# Patient Record
Sex: Female | Born: 1990
Health system: Southern US, Community
[De-identification: ages and names within clinical notes are randomized; demographics above are authoritative.]

## PROBLEM LIST (undated history)

## (undated) DIAGNOSIS — E119 Type 2 diabetes mellitus without complications: Secondary | ICD-10-CM

## (undated) DIAGNOSIS — R5382 Chronic fatigue, unspecified: Secondary | ICD-10-CM

## (undated) DIAGNOSIS — E8881 Metabolic syndrome: Secondary | ICD-10-CM

## (undated) DIAGNOSIS — G43109 Migraine with aura, not intractable, without status migrainosus: Secondary | ICD-10-CM

## (undated) DIAGNOSIS — M2550 Pain in unspecified joint: Secondary | ICD-10-CM

## (undated) DIAGNOSIS — E785 Hyperlipidemia, unspecified: Secondary | ICD-10-CM

## (undated) DIAGNOSIS — K589 Irritable bowel syndrome without diarrhea: Secondary | ICD-10-CM

## (undated) DIAGNOSIS — E282 Polycystic ovarian syndrome: Secondary | ICD-10-CM

## (undated) DIAGNOSIS — D509 Iron deficiency anemia, unspecified: Secondary | ICD-10-CM

## (undated) DIAGNOSIS — M797 Fibromyalgia: Secondary | ICD-10-CM

## (undated) DIAGNOSIS — G9332 Myalgic encephalomyelitis/chronic fatigue syndrome: Secondary | ICD-10-CM

## (undated) DIAGNOSIS — E559 Vitamin D deficiency, unspecified: Secondary | ICD-10-CM

## (undated) DIAGNOSIS — Z8632 Personal history of gestational diabetes: Secondary | ICD-10-CM

## (undated) DIAGNOSIS — M549 Dorsalgia, unspecified: Secondary | ICD-10-CM

## (undated) DIAGNOSIS — F419 Anxiety disorder, unspecified: Secondary | ICD-10-CM

## (undated) DIAGNOSIS — E039 Hypothyroidism, unspecified: Secondary | ICD-10-CM

## (undated) HISTORY — DX: Iron deficiency anemia, unspecified: D50.9

## (undated) HISTORY — DX: Pain in unspecified joint: M25.50

## (undated) HISTORY — DX: Irritable bowel syndrome, unspecified: K58.9

## (undated) HISTORY — DX: Migraine with aura, not intractable, without status migrainosus: G43.109

## (undated) HISTORY — DX: Vitamin D deficiency, unspecified: E55.9

## (undated) HISTORY — PX: TONSILLECTOMY: SUR1361

## (undated) HISTORY — DX: Metabolic syndrome: E88.81

## (undated) HISTORY — DX: Metabolic syndrome: E88.810

## (undated) HISTORY — DX: Chronic fatigue, unspecified: R53.82

## (undated) HISTORY — DX: Personal history of gestational diabetes: Z86.32

## (undated) HISTORY — DX: Type 2 diabetes mellitus without complications: E11.9

## (undated) HISTORY — DX: Polycystic ovarian syndrome: E28.2

## (undated) HISTORY — DX: Myalgic encephalomyelitis/chronic fatigue syndrome: G93.32

## (undated) HISTORY — DX: Fibromyalgia: M79.7

## (undated) HISTORY — DX: Anxiety disorder, unspecified: F41.9

## (undated) HISTORY — DX: Hyperlipidemia, unspecified: E78.5

## (undated) HISTORY — DX: Hypothyroidism, unspecified: E03.9

## (undated) HISTORY — DX: Dorsalgia, unspecified: M54.9

---

## 2008-12-01 ENCOUNTER — Ambulatory Visit: Payer: Self-pay | Admitting: Family Medicine

## 2008-12-01 DIAGNOSIS — R5383 Other fatigue: Secondary | ICD-10-CM

## 2008-12-01 DIAGNOSIS — R5381 Other malaise: Secondary | ICD-10-CM

## 2008-12-01 LAB — CONVERTED CEMR LAB: Hemoglobin: 13.8 g/dL

## 2008-12-02 ENCOUNTER — Encounter: Payer: Self-pay | Admitting: Family Medicine

## 2008-12-02 LAB — CONVERTED CEMR LAB
ALT: 12 units/L (ref 0–35)
AST: 14 units/L (ref 0–37)
Albumin: 4.3 g/dL (ref 3.5–5.2)
Alkaline Phosphatase: 39 units/L (ref 39–117)
BUN: 14 mg/dL (ref 6–23)
CO2: 23 meq/L (ref 19–32)
Calcium: 9.5 mg/dL (ref 8.4–10.5)
Chloride: 104 meq/L (ref 96–112)
Creatinine, Ser: 0.65 mg/dL (ref 0.40–1.20)
Glucose, Bld: 71 mg/dL (ref 70–99)
HCT: 38.8 % (ref 36.0–46.0)
Hemoglobin: 12.8 g/dL (ref 12.0–15.0)
INR: 1.1 (ref 0.0–1.5)
MCHC: 33 g/dL (ref 30.0–36.0)
MCV: 81.9 fL (ref 78.0–100.0)
Platelets: 248 10*3/uL (ref 150–400)
Potassium: 4.4 meq/L (ref 3.5–5.3)
Prothrombin Time: 15 s (ref 11.6–15.2)
RBC: 4.74 M/uL (ref 3.87–5.11)
RDW: 13.7 % (ref 11.5–15.5)
Sodium: 138 meq/L (ref 135–145)
TSH: 1.69 microintl units/mL (ref 0.350–4.500)
Total Bilirubin: 0.4 mg/dL (ref 0.3–1.2)
Total Protein: 7.3 g/dL (ref 6.0–8.3)
Vit D, 25-Hydroxy: 31 ng/mL (ref 30–89)
Vitamin B-12: 470 pg/mL (ref 211–911)
WBC: 7 10*3/uL (ref 4.0–10.5)

## 2008-12-12 ENCOUNTER — Encounter: Payer: Self-pay | Admitting: Family Medicine

## 2009-01-13 ENCOUNTER — Encounter: Payer: Self-pay | Admitting: Family Medicine

## 2009-01-14 ENCOUNTER — Encounter: Payer: Self-pay | Admitting: Family Medicine

## 2009-01-28 ENCOUNTER — Encounter: Payer: Self-pay | Admitting: Family Medicine

## 2009-05-14 ENCOUNTER — Ambulatory Visit: Payer: Self-pay | Admitting: Family Medicine

## 2009-05-14 DIAGNOSIS — R109 Unspecified abdominal pain: Secondary | ICD-10-CM

## 2009-05-15 ENCOUNTER — Encounter: Payer: Self-pay | Admitting: Family Medicine

## 2009-05-16 LAB — CONVERTED CEMR LAB
Trich, Wet Prep: NONE SEEN
Yeast Wet Prep HPF POC: NONE SEEN

## 2009-05-22 ENCOUNTER — Encounter: Admission: RE | Admit: 2009-05-22 | Discharge: 2009-05-22 | Payer: Self-pay | Admitting: Family Medicine

## 2009-10-14 ENCOUNTER — Ambulatory Visit: Payer: Self-pay | Admitting: Family Medicine

## 2009-10-15 LAB — CONVERTED CEMR LAB
ALT: 15 units/L (ref 0–35)
AST: 17 units/L (ref 0–37)
Albumin: 4.7 g/dL (ref 3.5–5.2)
Alkaline Phosphatase: 40 units/L (ref 39–117)
Amylase: 35 units/L (ref 0–105)
BUN: 11 mg/dL (ref 6–23)
Basophils Absolute: 0 10*3/uL (ref 0.0–0.1)
Basophils Relative: 0 % (ref 0–1)
CO2: 26 meq/L (ref 19–32)
Calcium: 9.3 mg/dL (ref 8.4–10.5)
Chloride: 103 meq/L (ref 96–112)
Creatinine, Ser: 0.85 mg/dL (ref 0.40–1.20)
Eosinophils Absolute: 0.1 10*3/uL (ref 0.0–0.7)
Eosinophils Relative: 2 % (ref 0–5)
Glucose, Bld: 79 mg/dL (ref 70–99)
HCT: 38.6 % (ref 36.0–46.0)
Hemoglobin: 12.9 g/dL (ref 12.0–15.0)
Lipase: 15 units/L (ref 0–75)
Lymphocytes Relative: 27 % (ref 12–46)
Lymphs Abs: 1.8 10*3/uL (ref 0.7–4.0)
MCHC: 33.4 g/dL (ref 30.0–36.0)
MCV: 82.7 fL (ref 78.0–100.0)
Monocytes Absolute: 0.8 10*3/uL (ref 0.1–1.0)
Monocytes Relative: 12 % (ref 3–12)
Neutro Abs: 3.9 10*3/uL (ref 1.7–7.7)
Neutrophils Relative %: 59 % (ref 43–77)
Platelets: 254 10*3/uL (ref 150–400)
Potassium: 4 meq/L (ref 3.5–5.3)
RBC: 4.67 M/uL (ref 3.87–5.11)
RDW: 13.4 % (ref 11.5–15.5)
Sodium: 137 meq/L (ref 135–145)
Total Bilirubin: 0.5 mg/dL (ref 0.3–1.2)
Total Protein: 7.6 g/dL (ref 6.0–8.3)
WBC: 6.6 10*3/uL (ref 4.0–10.5)

## 2009-10-27 ENCOUNTER — Telehealth: Payer: Self-pay | Admitting: Family Medicine

## 2010-02-25 ENCOUNTER — Ambulatory Visit: Payer: Self-pay | Admitting: Family Medicine

## 2010-02-25 DIAGNOSIS — R42 Dizziness and giddiness: Secondary | ICD-10-CM

## 2010-02-26 ENCOUNTER — Encounter: Payer: Self-pay | Admitting: Family Medicine

## 2010-02-26 LAB — CONVERTED CEMR LAB
Clue Cells Wet Prep HPF POC: NONE SEEN
Free T4: 1.21 ng/dL (ref 0.80–1.80)
Iron: 42 ug/dL (ref 42–145)
T3, Free: 3.4 pg/mL (ref 2.3–4.2)
TSH: 1.621 microintl units/mL (ref 0.350–4.500)
Trich, Wet Prep: NONE SEEN
Vit D, 25-Hydroxy: 33 ng/mL (ref 30–89)
Vitamin B-12: 443 pg/mL (ref 211–911)
WBC, Wet Prep HPF POC: NONE SEEN

## 2010-03-08 ENCOUNTER — Telehealth: Payer: Self-pay | Admitting: Family Medicine

## 2010-03-12 ENCOUNTER — Encounter: Payer: Self-pay | Admitting: Family Medicine

## 2010-03-17 ENCOUNTER — Encounter: Payer: Self-pay | Admitting: Family Medicine

## 2010-03-29 ENCOUNTER — Telehealth: Payer: Self-pay | Admitting: Family Medicine

## 2010-04-23 ENCOUNTER — Ambulatory Visit: Payer: Self-pay | Admitting: Family Medicine

## 2010-04-23 DIAGNOSIS — D509 Iron deficiency anemia, unspecified: Secondary | ICD-10-CM

## 2010-04-23 DIAGNOSIS — E559 Vitamin D deficiency, unspecified: Secondary | ICD-10-CM | POA: Insufficient documentation

## 2010-04-23 HISTORY — DX: Iron deficiency anemia, unspecified: D50.9

## 2010-04-27 ENCOUNTER — Telehealth: Payer: Self-pay | Admitting: Family Medicine

## 2010-04-30 ENCOUNTER — Encounter: Admission: RE | Admit: 2010-04-30 | Discharge: 2010-04-30 | Payer: Self-pay | Admitting: Family Medicine

## 2010-05-11 ENCOUNTER — Encounter: Payer: Self-pay | Admitting: Family Medicine

## 2010-05-13 ENCOUNTER — Ambulatory Visit: Payer: Self-pay | Admitting: Family Medicine

## 2010-05-26 ENCOUNTER — Encounter: Payer: Self-pay | Admitting: Family Medicine

## 2010-06-14 ENCOUNTER — Ambulatory Visit: Payer: Self-pay | Admitting: Family Medicine

## 2010-08-08 ENCOUNTER — Encounter: Payer: Self-pay | Admitting: Family Medicine

## 2010-08-10 ENCOUNTER — Telehealth: Payer: Self-pay | Admitting: Family Medicine

## 2010-08-13 ENCOUNTER — Encounter: Payer: Self-pay | Admitting: Family Medicine

## 2010-08-17 NOTE — Assessment & Plan Note (Signed)
Summary: FAtigue, dizziness, etc   Vital Signs:  Patient profile:   20 year old female Height:      65 inches Weight:      172 pounds Pulse rate:   88 / minute BP sitting:   120 / 73  (left arm) Cuff size:   regular  Vitals Entered By: Kathlene November (February 25, 2010 9:47 AM) CC: when wakes up and does not eat gets real weak and dizzy and then when eats gets bloated, nausea and tightness in the abdomin even on the Protonix   Primary Care Provider:  Nani Gasser MD  CC:  when wakes up and does not eat gets real weak and dizzy and then when eats gets bloated and nausea and tightness in the abdomin even on the Protonix.  History of Present Illness: when wakes up and does not eat gets real weak and dizzy and then when eats gets bloated, nausea and tightness in the abdomin even on the Protonix.  if waits too long to eat feels really dizzy, lightheaded, and feels shakey all over. Then when does get to eat feels very bloated, tight with a meal.  Doesn't happen with a small snack.  Protonix helped intially but no longer working. She is still on it.  Always feel tired.  Can't lose weight. Has been personal training for 2 months and not losing weight.Not sure if gaining muscle.  Eats about 1500 calories a day.  Feeling very fatigued like the "life has been sucked out of her". She says has felt like this in the past when her iron was low and a second time when her vitamin D was low.   Having vaginiatl itching and irrtation. thinks she has a yeast infection again. No longer having problems with her ovarian cysts so has not needed to start OCPs.   Current Medications (verified): 1)  Pantoprazole Sodium 40 Mg Tbec (Pantoprazole Sodium) .... Take 1 Tablet By Mouth Once A Day  Allergies (verified): 1)  ! * Cephalasporins  Comments:  Nurse/Medical Assistant: The patient's medications and allergies were reviewed with the patient and were updated in the Medication and Allergy Lists. Kathlene November  (February 25, 2010 9:48 AM)  Family History: Reviewed history from 12/01/2008 and no changes required. GF with LUng CA Mom and sister with depression GM with DM GF with DM Father with HTN and thyroid dz.   Physical Exam  General:  Well-developed,well-nourished,in no acute distress; alert,appropriate and cooperative throughout examination Head:  Normocephalic and atraumatic without obvious abnormalities. No apparent alopecia or balding. Neck:  No deformities, masses, or tenderness noted. NO TM.  Lungs:  Normal respiratory effort, chest expands symmetrically. Lungs are clear to auscultation, no crackles or wheezes. Heart:  Normal rate and regular rhythm. S1 and S2 normal without gallop, murmur, click, rub or other extra sounds. Abdomen:  Bowel sounds positive,abdomen soft and non-tender without masses, organomegaly or hernias noted. Skin:  no rashes.   Cervical Nodes:  No lymphadenopathy noted Psych:  Cognition and judgment appear intact. Alert and cooperative with normal attention span and concentration. No apparent delusions, illusions, hallucinations   Impression & Recommendations:  Problem # 1:  FATIGUE (ICD-780.79) Discussed will check iron, b12, vit D and TSH. does have family hx of thyroid dz and has been unable to lose weight.  Orders: T-TSH [phone]) T-Iron [phone]) T-Vitamin B12 [phone]) T-Vitamin D (25-Hydroxy) [phone]) T-T3, Free [phone]) T-T4, Free [phone])  Problem # 2:  DIZZINESS (ICD-780.4) Discussed that this is directly related  to not eating. likely getting some hypglycemia when skips meals. Make sur to eat regularly and healthy snacks in between.    Problem # 3:  VAGINITIS (ICD-616.10)  Orders: T-Wet Prep for Trich, Yeast, Clue Cells [phone])  Discussed symptomatic relief and treatment options.   Complete Medication List: 1)  Pantoprazole Sodium 40 Mg Tbec (Pantoprazole sodium) .... Take 1 tablet by mouth once a  day  Patient Instructions: 1)  Start a daily Women's one a day multivitamin 2)  We will call you with your lab results.

## 2010-08-17 NOTE — Letter (Signed)
Summary: Anxiety Questionnaire  Anxiety Questionnaire   Imported By: Lanelle Bal 05/05/2010 14:22:02  _____________________________________________________________________  External Attachment:    Type:   Image     Comment:   External Document

## 2010-08-17 NOTE — Assessment & Plan Note (Signed)
Summary: Wants to go on Birth-Control   Vital Signs:  Patient profile:   20 year old female Height:      65 inches Weight:      179 pounds BMI:     29.89 O2 Sat:      99 % on Room air Pulse rate:   93 / minute BP sitting:   124 / 79  (left arm) Cuff size:   regular  Vitals Entered By: Payton Spark CMA (May 13, 2010 1:24 PM)  O2 Flow:  Room air CC: Discuss starting OCPs   Primary Care Provider:  Nani Gasser MD  CC:  Discuss starting OCPs.  History of Present Illness: Here to dsicuss OCPs. having pelvic pain. [place] was neg for ovarian cysts.  She would like to start OCPs to help regulate her periods and she is getting married this summer. She is worried about her mood starting the pill.  She says her sister became depressed after starting th epill and she wonders if she should start an anitidepressant with the pill.    Current Medications (verified): 1)  None  Allergies (verified): 1)  ! * Cephalasporins  Physical Exam  General:  Well-developed,well-nourished,in no acute distress; alert,appropriate and cooperative throughout examination Head:  Normocephalic and atraumatic without obvious abnormalities. No apparent alopecia or balding.   Impression & Recommendations:  Problem # 1:  CONTRACEPTIVE MANAGEMENT (ICD-V25.09) Discussed options. she wants something that will help with her acne as well. Will start wtih sprintec. Reviewed how to appropriately take the medications and what to do if misses a pill.  Monitor for depression or mood change on teh pill. 20 min spent face to face in counseling.  Recheck BP in one month. warned about potential SE.  Complete Medication List: 1)  Sprintec 28 0.25-35 Mg-mcg Tabs (Norgestimate-eth estradiol) .... Take 1 tablet by mouth once a day  Patient Instructions: 1)  Please schedule a follow-up appointment in 1 month for blood pressure check with nurse.  2)  Call me if unhappy with the pill in a couple of months.    Prescriptions: SPRINTEC 28 0.25-35 MG-MCG TABS (NORGESTIMATE-ETH ESTRADIOL) Take 1 tablet by mouth once a day  #1 pack x 0   Entered and Authorized by:   Nani Gasser MD   Signed by:   Nani Gasser MD on 05/13/2010   Method used:   Electronically to        [facility]  [id number] (retail)       [address]       [place], [place]  [address]       Ph: [phone]       Fax: [phone]   RxID:   [id number]    Orders Added: 1)  Est. Patient Level III [[address]]

## 2010-08-17 NOTE — Progress Notes (Signed)
Summary: GI referral  Phone Note Call from Patient Call back at Home Phone [phone]   Caller: Patient Call For: Nani Gasser MD Summary of Call: Pt wants get a GI referral since everything came back normal.Prefers [facility] in [place] Initial call taken by: Kathlene November LPN,  April 27, 2010 9:33 AM  Follow-up for Phone Call        Pls let her know will await the [place] results before refer to GI.  Follow-up by: Nani Gasser MD,  April 27, 2010 10:33 AM  Additional Follow-up for Phone Call Additional follow up Details #1::        called and notified pt and pt voiced understanding.Pt has an ultrsound appt scheduled for this Friday Additional Follow-up by: Avon Gully CMA, Duncan Dull),  April 27, 2010 11:21 AM

## 2010-08-17 NOTE — Assessment & Plan Note (Signed)
Summary: Epigastric pain   Vital Signs:  Patient profile:   20 year old female Height:      65 inches Weight:      169 pounds BMI:     28.22 Temp:     98.1 degrees F oral Pulse rate:   90 / minute BP sitting:   124 / 73  (left arm) Cuff size:   regular  Vitals Entered By: Kathlene November (October 14, 2009 11:21 AM) CC: everytime eats upper epigastric area bloats feels tight and some cramps for 2 months now   Primary Care Provider:  Nani Gasser MD  CC:  everytime eats upper epigastric area bloats feels tight and some cramps for 2 months now.  History of Present Illness: everytime eats upper epigastric area bloats feels tight and some cramps for 2 months now.  Has been getting worse.  Feels tender in the epigstric area.  Pain is mild.  When happens has to sit down. Hurts to stand.  Doesn't feel gassy. No heartburn or reflux.  No family hx of gallbladder problems.  Occ gets nauseated if acidic foods or large amounts. No blood in the stool. Feels she has not changed her eating habits and is stillworking out 2 x a week but still gaining weight. Weight is up about 7 lbs from October. No alleviating sxs. No meds.   Current Medications (verified): 1)  None  Allergies (verified): 1)  ! * Cephalasporins  Comments:  Nurse/Medical Assistant: The patient's medications and allergies were reviewed with the patient and were updated in the Medication and Allergy Lists. Kathlene November (October 14, 2009 11:22 AM)  Physical Exam  General:  Well-developed,well-nourished,in no acute distress; alert,appropriate and cooperative throughout examination Head:  Normocephalic and atraumatic without obvious abnormalities. No apparent alopecia or balding. Lungs:  Normal respiratory effort, chest expands symmetrically. Lungs are clear to auscultation, no crackles or wheezes. Heart:  Normal rate and regular rhythm. S1 and S2 normal without gallop, murmur, click, rub or other extra sounds. Abdomen:  soft,  normal bowel sounds, no distention, no masses, no hepatomegaly, and no splenomegaly.  Tender in teh epigastrum and thr RUQ.   Skin:  no rashes.   Psych:  Cognition and judgment appear intact. Alert and cooperative with normal attention span and concentration. No apparent delusions, illusions, hallucinations   Impression & Recommendations:  Problem # 1:  EPIGASTRIC PAIN (ICD-789.06) Discussed differential of Gastritis vs GB vs pancreas  Will start a PPI for sxs releif. Samples of Protonix given.   Will get labs to rule out liver or pancreas d/o.  Will schedule a GB US to eval for stones.  If PPI is helping then please call the office and let me know.  Orders: T-Comprehensive Metabolic Panel [phone]) T-CBC w/Diff [phone]) T-Amylase [phone]) T-Lipase [phone]) T-Ultrasound Abdominal, Complete ([id number])

## 2010-08-17 NOTE — Assessment & Plan Note (Signed)
Summary: F/u on BP with Birthcontrol- jr  Nurse Visit   Vital Signs:  Patient profile:   20 year old female Pulse rate:   67 / minute BP sitting:   123 / 79  (right arm)  Primary Care Provider:  Nani Gasser MD   History of Present Illness: BP check pt is on Sprintec.    Impression & Recommendations:  Problem # 1:  CONTRACEPTIVE MANAGEMENT (ICD-V25.09) BP looks great on OCPs.   Complete Medication List: 1)  Sprintec 28 0.25-35 Mg-mcg Tabs (Norgestimate-eth estradiol) .... Take 1 tablet by mouth once a day   Allergies: 1)  ! * Cephalasporins  Orders Added: 1)  Est. Patient Level I [[address]] Prescriptions: SPRINTEC 28 0.25-35 MG-MCG TABS (NORGESTIMATE-ETH ESTRADIOL) Take 1 tablet by mouth once a day  #1 pack x 3   Entered by:   Avon Gully CMA, (AAMA)   Authorized by:   Nani Gasser MD   Signed by:   Avon Gully CMA, (AAMA) on 06/14/2010   Method used:   Electronically to        [facility] [phone]* (retail)       [address]       [place], [place]  [address]       Ph: [phone]       Fax: [phone]   RxID:   [id number]

## 2010-08-17 NOTE — Progress Notes (Signed)
Summary: protonix helped- would like a rx  Phone Note Call from Patient   Summary of Call: Protonix helped alot- has no symptoms. Can she get a rx called into [facility] Initial call taken by: Kathlene November,  October 27, 2009 11:35 AM    New/Updated Medications: PANTOPRAZOLE SODIUM 40 MG TBEC (PANTOPRAZOLE SODIUM) Take 1 tablet by mouth once a day Prescriptions: PANTOPRAZOLE SODIUM 40 MG TBEC (PANTOPRAZOLE SODIUM) Take 1 tablet by mouth once a day  #30 x 1   Entered and Authorized by:   Nani Gasser MD   Signed by:   Nani Gasser MD on 10/27/2009   Method used:   Electronically to        [facility]  [id number] (retail)       [address]       [place], [place]  [address]       Ph: [phone]       Fax: [phone]   RxID:   [id number]

## 2010-08-17 NOTE — Progress Notes (Signed)
Summary: referral  Phone Note Call from Patient   Caller: Patient Call For: Nani Gasser MD Summary of Call: Pt called and states since her labs were normal she wants to be referred to an Endocrinologist Initial call taken by: Avon Gully CMA, Duncan Dull),  March 08, 2010 4:46 PM  Follow-up for Phone Call        OK ,will refer.  Follow-up by: Nani Gasser MD,  March 08, 2010 4:47 PM  Additional Follow-up for Phone Call Additional follow up Details #1::        pt notified Additional Follow-up by: Avon Gully CMA, Duncan Dull),  March 08, 2010 4:58 PM

## 2010-08-17 NOTE — Consult Note (Signed)
Summary: [facility]   Imported By: Lanelle Bal 05/26/2010 11:19:15  _____________________________________________________________________  External Attachment:    Type:   Image     Comment:   External Document

## 2010-08-17 NOTE — Procedures (Signed)
Summary: [facility]  [facility]   Imported By: Lanelle Bal 06/08/2010 11:17:54  _____________________________________________________________________  External Attachment:    Type:   Image     Comment:   External Document

## 2010-08-17 NOTE — Progress Notes (Signed)
Summary: Yeast infection med  Phone Note Call from Patient Call back at Home Phone [phone]   Caller: Patient Call For: Nani Gasser MD Summary of Call: Pt calls to ask if she can get a rx for Diflucan to use as needed for yeast infections. States you and her have talked about it nad you told her she is just prone to them and she really didn't want to keep coming in office for them since gets them regularly. Has one now Initial call taken by: Kathlene November,  March 29, 2010 9:44 AM  Follow-up for Phone Call        I gave her one rx wtih 2 refills 4 weeks ago.  She probably didn't realize she has refills at the pharmacy.  Follow-up by: Nani Gasser MD,  March 29, 2010 12:23 PM  Additional Follow-up for Phone Call Additional follow up Details #1::        Pt notifed via VM of above info Additional Follow-up by: Kathlene November,  March 29, 2010 12:46 PM

## 2010-08-17 NOTE — Consult Note (Signed)
Summary: [facility]  [facility]   Imported By: Sherian Rein 04/03/2010 10:41:34  _____________________________________________________________________  External Attachment:    Type:   Image     Comment:   External Document

## 2010-08-17 NOTE — Assessment & Plan Note (Signed)
Summary: F/U pelvic Pain   Vital Signs:  Patient profile:   20 year old female Height:      65 inches Weight:      176 pounds Pulse rate:   87 / minute BP sitting:   110 / 67  (right arm) Cuff size:   regular  Vitals Entered By: Avon Gully CMA, Duncan Dull) (April 23, 2010 1:54 PM) CC: fatigue, weak,nausea, feels anxious, lower abd pain x 2 months, taking womesn multivit not helping   Primary Care Provider:  Nani Gasser MD  CC:  fatigue, weak, nausea, feels anxious, lower abd pain x 2 months, and taking womesn multivit not helping.  History of Present Illness: fatigue, weak,nausea, feels anxious, lower abd pain x 2 months, taking womesn multivit not helping.  Not sleeping well. Having difficulty falling sleep. Her acne is worse.  DIfficulty lossing weight.  Gaining weight in her stomach.  Feels like has to pee all the time.  Will feel constipate but then will go more than often. Severe pain in her lower stomach like her cyst.  USually coupled wiht back pain. Initially thought pulled a muscle but not better. Pain is dull. When stomach hurts the back pain becomes peircing. Stomach feels tight.  Dec appetite. Early satiety.  Did start heriron and vita D supplement. Did have blood in the stool the first month but not his last month. Periods are more heavy and painful than usual.   Saw endocrinology and told doesn't have PCOS adn normal thyroid.  Body will ache all over.   Current Medications (verified): 1)  Pantoprazole Sodium 40 Mg Tbec (Pantoprazole Sodium) .... Take 1 Tablet By Mouth Once A Day  Allergies (verified): 1)  ! * Cephalasporins  Comments:  Nurse/Medical Assistant: The patient's medications and allergies were reviewed with the patient and were updated in the Medication and Allergy Lists. Avon Gully CMA, Duncan Dull) (April 23, 2010 1:55 PM)  Social History: Reviewed history from 12/01/2008 and no changes required. 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  Single. Lives at home with mom, dad, sisters.   Never Smoked Alcohol use-no Drug use-no Regular exercise-yes  Physical Exam  General:  Well-developed,well-nourished,in no acute distress; alert,appropriate and cooperative throughout examination Skin:  no rashes.   Psych:  Cognition and judgment appear intact. Alert and cooperative with normal attention span and concentration. No apparent delusions, illusions, hallucinations   Impression & Recommendations:  Problem # 1:  PELVIC  PAIN (ICD-789.09)  Will schedule for pelvic US. His of recurrent ovarian cysts as a  teenager and her sxs feel similar to these but worse and more persistant. Has felt like this for 3 months.  Also will test for gluten enteropahty and lactose intolerance. If [place] and tests are normla will refer to GI.  Consider may be anxiety/depression related as her uncle drowned this summer Her GAD-7 socre was 8 today. 20 min spent in dicussion and counseling.  Orders: T-*Unlisted Diagnostic X-ray test/procedure [id number]) T- * Misc. Laboratory test [phone]) T-Gliadin Peptide Antibodies, IgG, LgA ([id number])  Problem # 2:  ANEMIA, IRON DEFICIENCY (ICD-280.9) Due to rehceck since has been taking a supplement.  Orders: T-Iron ([id number])  Problem # 3:  VITAMIN D DEFICIENCY (ICD-268.9) Due to recheck since has been taking a supplement.  Orders: T-Vitamin D (25-Hydroxy) [id number])  Complete Medication List: 1)  Pantoprazole Sodium 40 Mg Tbec (Pantoprazole sodium) .... Take 1 tablet by mouth once a day  Patient Instructions: 1)  We  will call you with the [place] appt.   2)  We will call you with your lab results.   Contraindications/Deferment of Procedures/Staging:    Test/Procedure: FLU VAX    Reason for deferment: patient declined

## 2010-08-18 IMAGING — US US PELVIS COMPLETE
1 series · 14 of 25 positions shown · non-contrast
Comparison: None

CLINICAL DATA: Recurring pelvic pain.  Evaluate for ovarian cyst.
The patient is not sexually active.  Although both transabdominal
and transvaginal studies requested, only the transabdominal study
could be performed.

TRANSABDOMINAL ULTRASOUND OF PELVIS
TECHNIQUE: Transabdominal ultrasound examination of the pelvis was
performed including evaluation of the uterus, ovaries, adnexal
regions, and pelvic cul-de-sac.

[Series 1: us pelvis complete · 0.23mm/px · 14 of 35 slices shown]
[im 1/35]
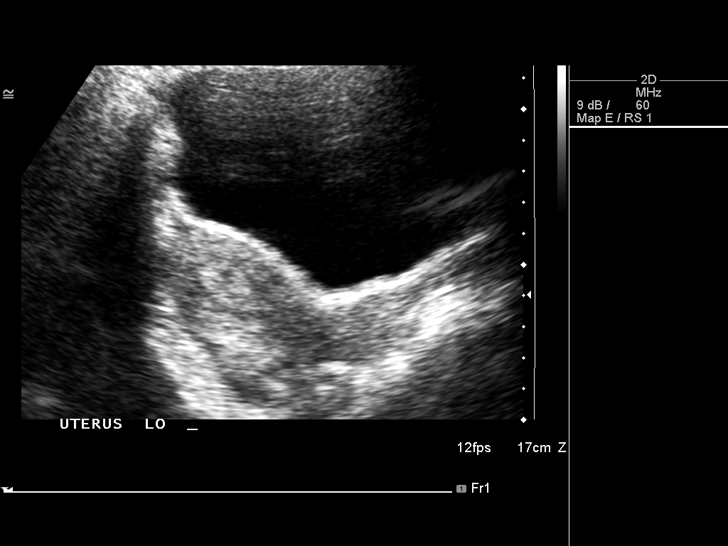
[im 3/35]
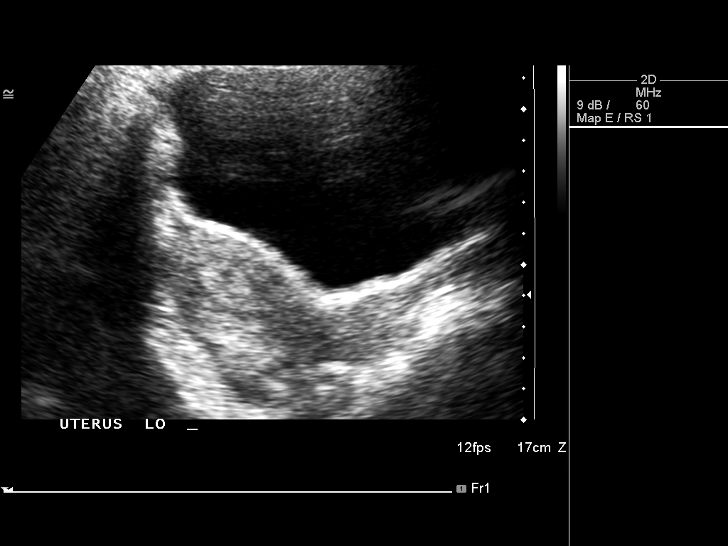
[im 6/35]
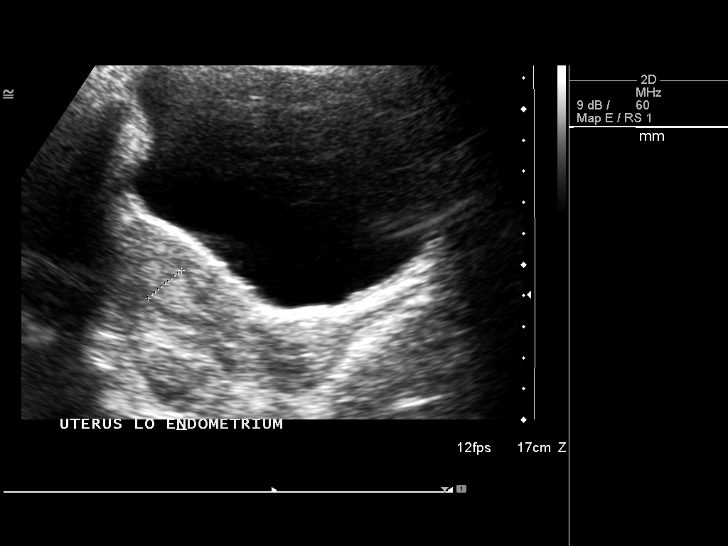
[im 9/35]
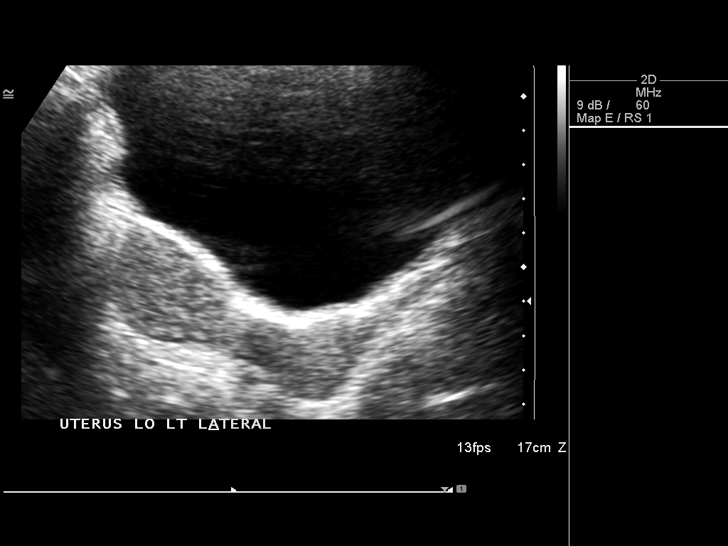
[im 12/35]
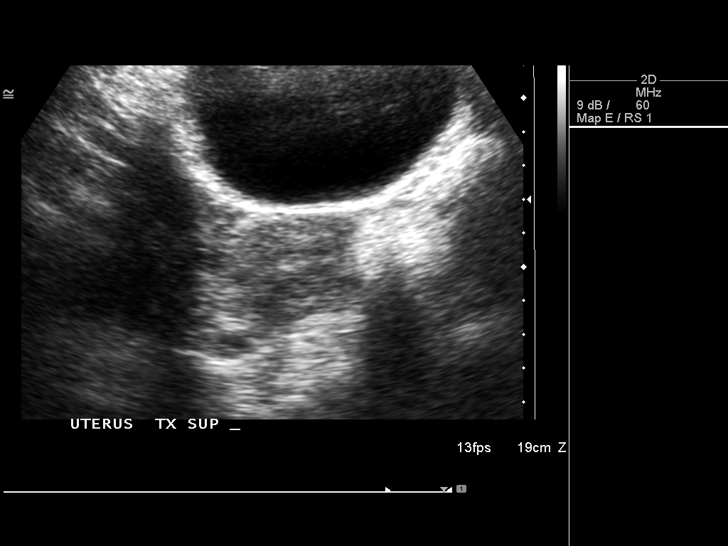
[im 13/35]
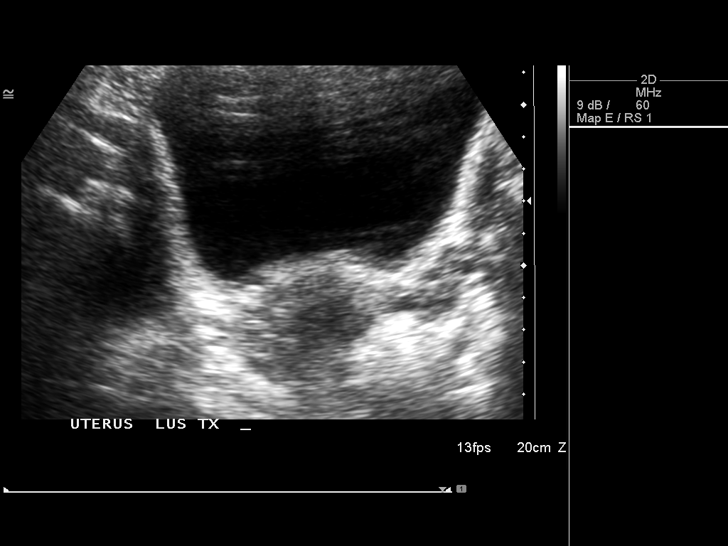
[im 16/35]
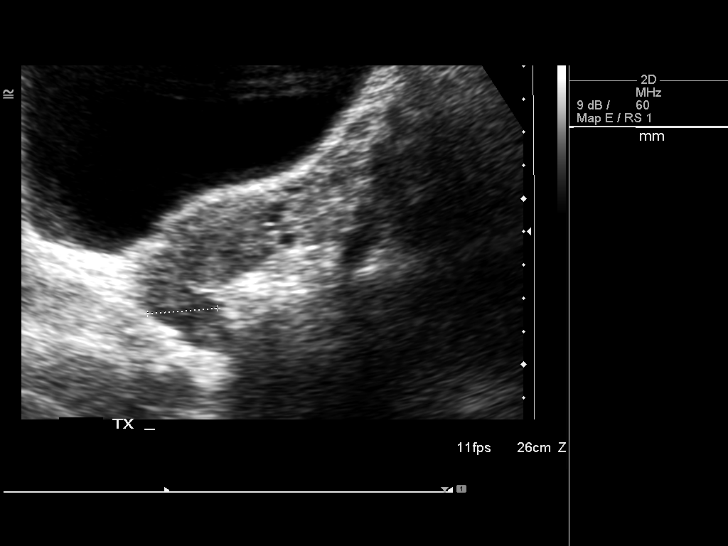
[im 19/35]
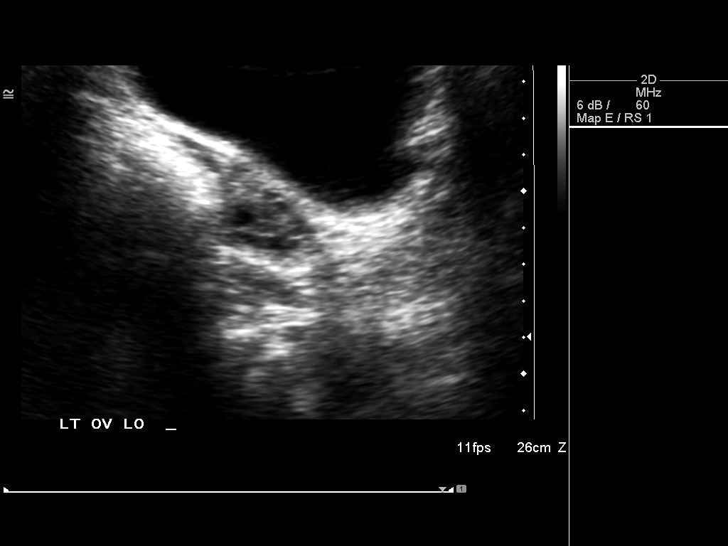
[im 22/35]
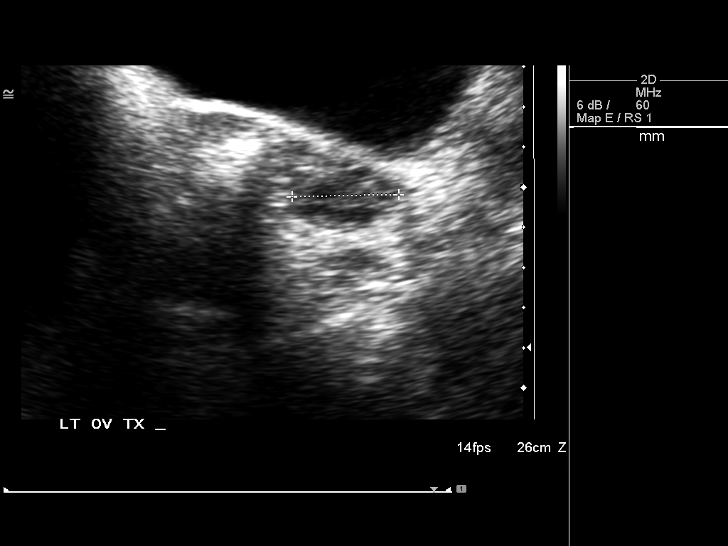
[im 23/35]
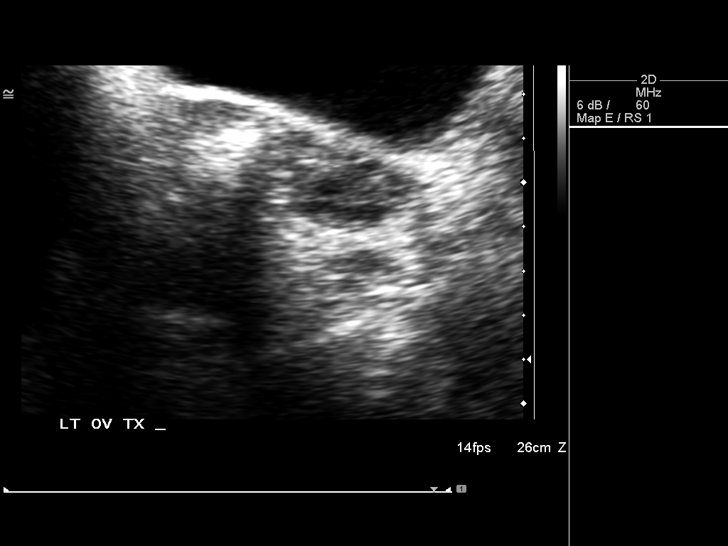
[im 26/35]
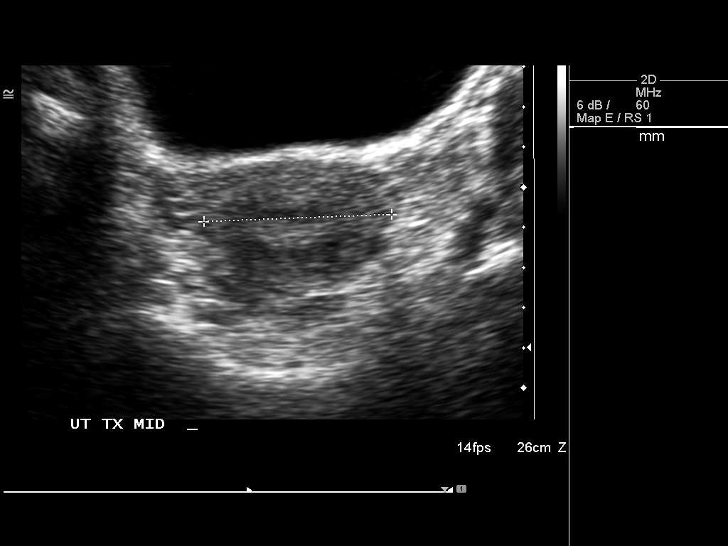
[im 29/35]
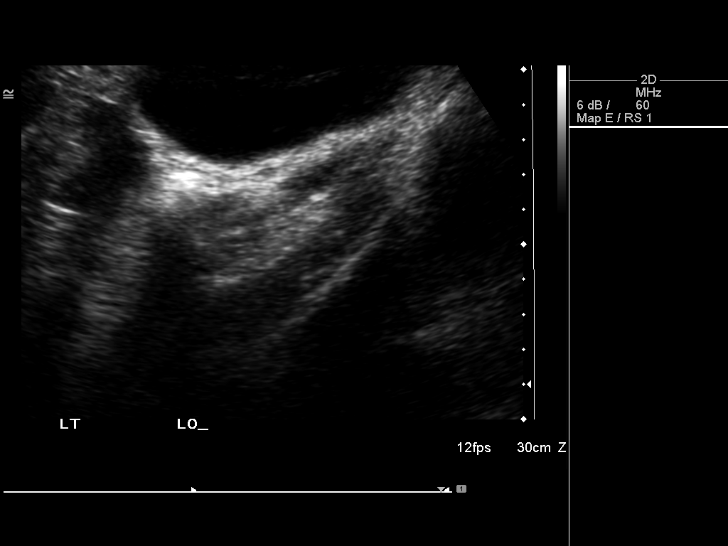
[im 32/35]
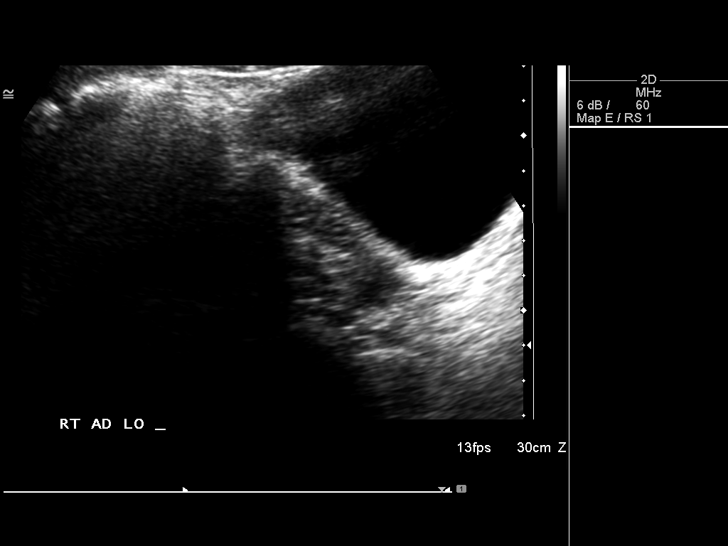
[im 35/35]
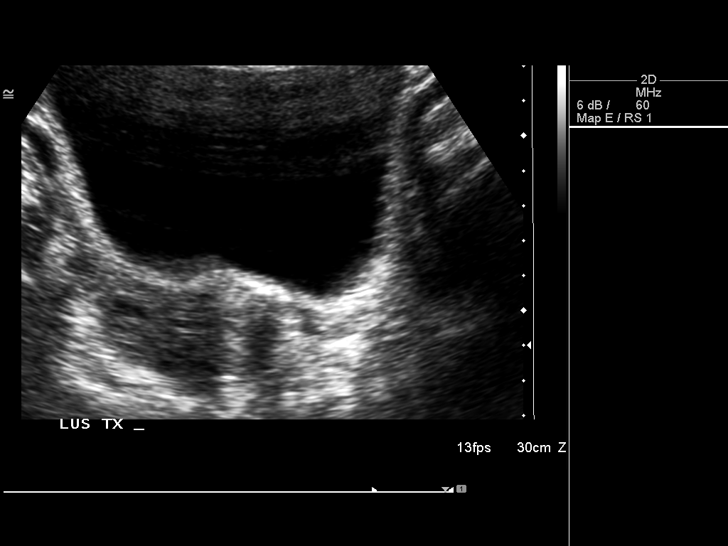

[14 of 25 positions shown; findings below may reference images not displayed]

FINDINGS: Uterus demonstrates a sagittal length of 7.7 cm, an AP width of
cm and a transverse width of 5.1 cm.  A homogeneous uterine
myometrium is seen.

Endometrium demonstrates an AP width of 7.7 mm.  This appears
homogeneously echogenic with no areas of focal thickening noted.

Right Ovary has a normal appearance transabdominally measuring
x 1.7 x 2.1 cm and containing several small follicles

Left Ovary has a normal appearance transabdominally measuring 2.5 x
1.6 x 2.7 cm in content taking several small follicles.

Other Findings:  No pelvic fluid or separate adnexal masses are
seen.
IMPRESSION: Normal transabdominal pelvic ultrasound.

## 2010-08-25 NOTE — Progress Notes (Signed)
Summary: [facility]-Question about med  Phone Note Call from Patient Call back at Home Phone [phone]   Call For: Nani Gasser MD Summary of Call: question about birth control pill  Initial call taken by: Francee Piccolo CMA Duncan Dull),  August 10, 2010 11:39 AM  Follow-up for Phone Call        LM to [facility] at home number Francee Piccolo CMA Duncan Dull)  August 10, 2010 12:26 PM   RC from pt.  She is not sexually active now, but will be getting married in June. Originally started OCP to help control acne.  Pt will be starting Tetracycline and would like to know options for birth control once she is married and sexually active in June.  Pt has researched non-hormonal options and has looked at the IUD, but is aware those are recommended for pt's who have had at least one child.  Please advise. Follow-up by: Francee Piccolo CMA Duncan Dull),  August 10, 2010 1:40 PM  Additional Follow-up for Phone Call Additional follow up Details #1::        The tetracycline wil affected the effective of most combo birth control, but yes Depo Provera shot every 3 months or mirena IUD are options since they are progesterone only. Yes and iUD is easier to put in if you have had a child but they can still be put in.  OB could help determine if good candidate with her cervix.  Additional Follow-up by: Nani Gasser MD,  August 11, 2010 11:35 AM    Additional Follow-up for Phone Call Additional follow up Details #2::    LM to [facility] at home number Francee Piccolo CMA Duncan Dull)  August 17, 2010 10:47 AM  VM left by pt returning call at 1:11pm. RC to pt w/OK to leave message on VM.  Advised pt of above. Follow-up by: Francee Piccolo CMA Duncan Dull),  August 17, 2010 2:26 PM

## 2010-09-03 ENCOUNTER — Encounter: Payer: Self-pay | Admitting: Family Medicine

## 2010-09-14 NOTE — Letter (Signed)
Summary: [facility]  [facility]   Imported By: Maryln Gottron 09/06/2010 11:24:09  _____________________________________________________________________  External Attachment:    Type:   Image     Comment:   External Document

## 2010-09-15 ENCOUNTER — Ambulatory Visit (INDEPENDENT_AMBULATORY_CARE_PROVIDER_SITE_OTHER): Payer: BC Managed Care – PPO | Admitting: Family Medicine

## 2010-09-15 ENCOUNTER — Encounter: Payer: Self-pay | Admitting: Family Medicine

## 2010-09-15 DIAGNOSIS — Z3009 Encounter for other general counseling and advice on contraception: Secondary | ICD-10-CM

## 2010-09-15 DIAGNOSIS — J019 Acute sinusitis, unspecified: Secondary | ICD-10-CM

## 2010-09-23 NOTE — Assessment & Plan Note (Signed)
Summary: Sinusitis   Vital Signs:  Patient profile:   20 year old female Height:      65 inches Weight:      184 pounds Temp:     98.1 degrees F oral Pulse rate:   87 / minute BP sitting:   117 / 79  (right arm) Cuff size:   regular  Vitals Entered By: Avon Gully CMA, Duncan Dull) (September 15, 2010 9:35 AM) CC: sinsu pressure and congestion on and off x 1 month   Primary Care Provider:  Nani Gasser MD  CC:  sinsu pressure and congestion on and off x 1 month.  History of Present Illness: Has had sxs for 1 months.  Says will get slighlty better adn then gets worse. NO fever.  No change in bowels. Lots of pain and pressure over the maxillary sinuses.  Mild ear pain and pressure.  Mild cough for a few days.  NO SOB.   Current Medications (verified): 1)  Sprintec 28 0.25-35 Mg-Mcg Tabs (Norgestimate-Eth Estradiol) .... Take 1 Tablet By Mouth Once A Day  Allergies (verified): 1)  ! * Cephalasporins  Comments:  Nurse/Medical Assistant: The patient's medications and allergies were reviewed with the patient and were updated in the Medication and Allergy Lists. Avon Gully CMA, Duncan Dull) (September 15, 2010 9:36 AM)  Physical Exam  General:  Well-developed,well-nourished,in no acute distress; alert,appropriate and cooperative throughout examination Head:  Normocephalic and atraumatic without obvious abnormalities. No apparent alopecia or balding. Eyes:  No corneal or conjunctival inflammation noted. EOMI. Perrla. Ears:  External ear exam shows no significant lesions or deformities.  Otoscopic examination reveals clear canals, tympanic membranes are intact bilaterally without bulging, retraction, inflammation or discharge. Hearing is grossly normal bilaterally. Nose:  External nasal examination shows no deformity or inflammation. Nasal mucosa are pink and moist without lesions or exudates. Turbinates are just some swollen.  Mouth:  Oral mucosa and oropharynx without lesions  or exudates.  Teeth in good repair. Neck:  No deformities, masses, or tenderness noted. Lungs:  Normal respiratory effort, chest expands symmetrically. Lungs are clear to auscultation, no crackles or wheezes. Heart:  Normal rate and regular rhythm. S1 and S2 normal without gallop, murmur, click, rub or other extra sounds. Skin:  no rashes.   Cervical Nodes:  No lymphadenopathy noted Psych:  Cognition and judgment appear intact. Alert and cooperative with normal attention span and concentration. No apparent delusions, illusions, hallucinations   Impression & Recommendations:  Problem # 1:  SINUSITIS - ACUTE-NOS (ICD-461.9)  Her updated medication list for this problem includes:    Fluticasone Propionate 50 Mcg/act Susp (Fluticasone propionate) .Marland Kitchen... 2 sprays in each nostril once a day.    Amoxicillin 875 Mg Tabs (Amoxicillin) .Marland Kitchen... Take 1 tablet by mouth two times a day for 10 days  Instructed on treatment. Call if symptoms persist or worsen.   Problem # 2:  CONTRACEPTIVE MANAGEMENT (ICD-V25.09) Doing well on her OCPs. Happy with it. Her BP looks great. Will refill.   Complete Medication List: 1)  Sprintec 28 0.25-35 Mg-mcg Tabs (Norgestimate-eth estradiol) .... Take 1 tablet by mouth once a day 2)  Fluticasone Propionate 50 Mcg/act Susp (Fluticasone propionate) .... 2 sprays in each nostril once a day. 3)  Amoxicillin 875 Mg Tabs (Amoxicillin) .... Take 1 tablet by mouth two times a day for 10 days  Patient Instructions: 1)  Can use nasal saline to rinse out your nose 2)  Complete the antibiotic.  3)  CAll if not better in  one week.   4)  Can add the nasal spray to help as well.  Prescriptions: SPRINTEC 28 0.25-35 MG-MCG TABS (NORGESTIMATE-ETH ESTRADIOL) Take 1 tablet by mouth once a day  #1 pack x 6   Entered and Authorized by:   Nani Gasser MD   Signed by:   Nani Gasser MD on 09/15/2010   Method used:   Electronically to        [facility]  [id number]  (retail)       [address]       [place], [place]  [address]       Ph: [phone]       Fax: [phone]   RxID:   [id number] AMOXICILLIN 875 MG TABS (AMOXICILLIN) Take 1 tablet by mouth two times a day for 10 days  #0 x 0   Entered and Authorized by:   Nani Gasser MD   Signed by:   Nani Gasser MD on 09/15/2010   Method used:   Electronically to        [facility]  [id number] (retail)       [address]       [place], [place]  [address]       Ph: [phone]       Fax: [phone]   RxID:   [id number] FLUTICASONE PROPIONATE 50 MCG/ACT SUSP (FLUTICASONE PROPIONATE) 2 sprays in each nostril once a day.  #1 x 1   Entered and Authorized by:   Nani Gasser MD   Signed by:   Nani Gasser MD on 09/15/2010   Method used:   Electronically to        [facility]  [address] [phone]* (retail)       [address]       [place], [place]  [address]       Ph: [phone]       Fax: [phone]   RxID:   [id number]    Orders Added: 1)  Est. Patient Level III [82993]

## 2010-10-21 ENCOUNTER — Encounter: Payer: Self-pay | Admitting: Family Medicine

## 2010-10-21 ENCOUNTER — Ambulatory Visit (INDEPENDENT_AMBULATORY_CARE_PROVIDER_SITE_OTHER): Payer: BC Managed Care – PPO | Admitting: Family Medicine

## 2010-10-21 ENCOUNTER — Telehealth: Payer: Self-pay | Admitting: Family Medicine

## 2010-10-21 ENCOUNTER — Ambulatory Visit
Admission: RE | Admit: 2010-10-21 | Discharge: 2010-10-21 | Disposition: A | Discharge status: Home / Self Care | Payer: BC Managed Care – PPO | Source: Ambulatory Visit | Attending: Family Medicine | Admitting: Family Medicine

## 2010-10-21 VITALS — BP 106/65 | HR 69 | Ht 65.0 in | Wt 179.0 lb

## 2010-10-21 DIAGNOSIS — R1031 Right lower quadrant pain: Secondary | ICD-10-CM

## 2010-10-21 DIAGNOSIS — J019 Acute sinusitis, unspecified: Secondary | ICD-10-CM

## 2010-10-21 LAB — POCT URINALYSIS DIPSTICK
Nitrite, UA: NEGATIVE
Protein, UA: NEGATIVE
Spec Grav, UA: 1.02
Urobilinogen, UA: 0.2
pH, UA: 7.5

## 2010-10-21 LAB — POCT URINE PREGNANCY: Preg Test, Ur: NEGATIVE

## 2010-10-21 MED ORDER — IOHEXOL 300 MG/ML  SOLN
100.0000 mL | Freq: Once | INTRAMUSCULAR | Status: AC | PRN
  Administered 2010-10-21: 100 mL via INTRAVENOUS

## 2010-10-21 MED ORDER — KETOROLAC TROMETHAMINE 60 MG/2ML IM SOLN
60.0000 mg | Freq: Once | INTRAMUSCULAR | Status: AC
  Administered 2010-10-21: 60 mg via INTRAMUSCULAR

## 2010-10-21 MED ORDER — CIPROFLOXACIN HCL 500 MG PO TABS: 500.0000 mg | ORAL_TABLET | Freq: Two times a day (BID) | ORAL | Status: AC

## 2010-10-21 NOTE — Telephone Encounter (Signed)
LM with her CT results. Will treat her for gastroenteritis.  Will send over ABX for cipro.  Call if not improving or to ED if suddenly worse.

## 2010-10-21 NOTE — Progress Notes (Signed)
  Subjective:    Patient ID: Veronica Gay, female    DOB: 1991-03-20, 20 y.o.   MRN: [id number]  HPI Last thurs vomited about 3 times and has stays nauseated sine then. No appetite.  Only eating 1-2 x a day since then. They yesterday started getting pain in her right LQ and above her hip, radiating into her right low back. Lots of gurgling and diarrhea started last night.  Can feel a weird sensation in her right leg. Feels like a dull feeling. Period was 2 weeks ago.  Off her birth control. Had some vaginal spotting last night.  No hematuria.  No blood in the stool.  Still  has her appendix.  No fever or chills. Not sure if any fever.  Took IBU yesterday - no relief.  No alleviating sxs except laying on her left side.  Worse if puts pressure on the area. Not sexually active.    Review of Systems     Objective:   Physical Exam  Constitutional: She appears well-developed and well-nourished.  HENT:  Head: Normocephalic.  Cardiovascular: Normal rate, regular rhythm and normal heart sounds.   Pulmonary/Chest: Effort normal and breath sounds normal.  Abdominal: Normal appearance and bowel sounds are normal. She exhibits distension. There is no hepatosplenomegaly. There is tenderness in the right lower quadrant. There is CVA tenderness. There is no rebound and no guarding.          Assessment & Plan:  RLQ and flank pain- UA + for trace blood. Will send a culture. Stat CBC and BMP. conisder appendicitis vs UTI vs colitis vs cyst on the ovary. She is extremely tender on exam and I am concerned about her severe nausea. Will schedule for abd/pelvic CT with contrast for further eval. UPT is neg. She is not currently sexually active, thus STD is unlikely. Given toradol IM for acute pain. Depending on results of CT will also rx pain medications.

## 2010-10-22 LAB — BASIC METABOLIC PANEL WITH GFR
BUN: 12 mg/dL (ref 6–23)
CO2: 23 mEq/L (ref 19–32)
Calcium: 9.9 mg/dL (ref 8.4–10.5)
Creat: 0.77 mg/dL (ref 0.40–1.20)
GFR, Est Non African American: >60 mL/min (ref >60)
Glucose, Bld: 90 mg/dL (ref 70–99)
Potassium: 4.1 mEq/L (ref 3.5–5.3)
Sodium: 140 mEq/L (ref 135–145)

## 2010-10-23 LAB — URINE CULTURE: Colony Count: 30000

## 2010-10-25 ENCOUNTER — Telehealth: Payer: Self-pay | Admitting: Family Medicine

## 2010-10-25 NOTE — Telephone Encounter (Signed)
LMOM informing Pt  

## 2010-10-25 NOTE — Telephone Encounter (Signed)
Let call and see if still needs pain meds or if feeling better.

## 2010-10-25 NOTE — Telephone Encounter (Signed)
[facility] Triage Call Report Triage Record Num: [id number] Operator: Jeraldine Loots Patient Name: Veronica Gay Call Date & Time: 10/22/2010 10:13:27AM Patient Phone: [phone] PCP: Patient Gender: Female PCP Fax : Patient DOB: [birth year] [facility] Name: [facility] Reason for Call: Pt calling, she was seen on Thursday, 4/5 and was told that she has gastroenteritis. Antibiotics were called in but the pain medication was not at the pharmacy. Called the office, they are going to pull the chart and see if documentation is there for the pain medication. If it is, they will call it in. Otherwise pt will need to f/u on Monday. Uses [facility] [address] at [phone]. Pt advised and will check with the pharmacy around 2p today.

## 2010-10-25 NOTE — Telephone Encounter (Signed)
Call pt: Rest of labs and urine culture was neg. Is she feeling any better?

## 2010-11-09 ENCOUNTER — Telehealth: Payer: Self-pay | Admitting: Family Medicine

## 2010-11-09 NOTE — Telephone Encounter (Signed)
Pt. Called at 5:08PM on Monday 11-08-10 and left a message stating that she needs Dr.Metheney's nurse to call her back in the morning.

## 2010-11-10 NOTE — Telephone Encounter (Signed)
Called pt and spoke to her about her concerns.pt voiced understanding

## 2010-11-16 ENCOUNTER — Ambulatory Visit (INDEPENDENT_AMBULATORY_CARE_PROVIDER_SITE_OTHER): Payer: BC Managed Care – HMO | Admitting: Obstetrics and Gynecology

## 2010-11-16 ENCOUNTER — Other Ambulatory Visit: Payer: Self-pay | Admitting: Obstetrics and Gynecology

## 2010-11-16 DIAGNOSIS — Z3040 Encounter for surveillance of contraceptives, unspecified: Secondary | ICD-10-CM

## 2010-11-16 DIAGNOSIS — Z01419 Encounter for gynecological examination (general) (routine) without abnormal findings: Secondary | ICD-10-CM

## 2010-11-16 DIAGNOSIS — Z113 Encounter for screening for infections with a predominantly sexual mode of transmission: Secondary | ICD-10-CM

## 2010-11-16 DIAGNOSIS — Z1272 Encounter for screening for malignant neoplasm of vagina: Secondary | ICD-10-CM

## 2010-11-17 NOTE — Assessment & Plan Note (Signed)
NAME:  Veronica Gay, Veronica Gay               ACCOUNT NO.:  [id number]  MEDICAL RECORD NO.:  [id number]           PATIENT TYPE:  LOCATION:  [facility]           FACILITY:  PHYSICIAN:  Catalina Antigua, MD          DATE OF BIRTH:  DATE OF SERVICE:  11/16/2010                                 CLINIC NOTE  This is a 20 year old nulligravida patient with LMP of October 01, 2010, who presents today for annual exam as well as consultation for ParaGard IUD.  The patient is currently without any complaints and denies any abnormal bleeding or discharge.  The patient is getting married on June 2 and was interested in not having her period during that time.  The patient had received an information on the ParaGard IUD in the past and would desires that birth control option for the time being.  PAST MEDICAL HISTORY:  Significant for irritable bowel syndrome.  PAST SURGICAL HISTORY:  She denies.  PAST OBSTETRIC AND GYNECOLOGIC HISTORY:  Menarche started at the age of 65.  They typically lasts 4 days, heavy and accompanied with mild cramping.  She is currently using birth control pills for birth control. The patient denies any history of cysts or fibroids.  She has never had a Pap smear prior to today.  FAMILY HISTORY:  Significant for diabetes and hypertension and a maternal aunt with breast cancer in her 67s, although the patient is unclear as to her maternal aunt's history.  SOCIAL HISTORY:  She denies drinking, smoking or the use of illicit drugs.  REVIEW OF SYSTEMS:  Otherwise significant for abdominal discomfort related to her irritable bowel syndrome.  She reports an allergy to CEPHALOSPORINS for which she gets hives. Denies an allergy to latex and she is currently taking doxycycline 100 mg b.i.d. for her irritable bowel syndrome, Keppra 250 mg b.i.d.  For her irritable bowel syndrome Xifaxan.  PHYSICAL EXAMINATION:  VITAL SIGNS:  Her blood pressure is 122/75, pulse of 98, weight of 182  pounds, height of 64 inches. LUNGS:  Clear to auscultation bilaterally. HEART:  Regular rate and rhythm. BREASTS:  Nontender, equal in size.  No expressible nipple discharge. No palpable lymphadenopathy or masses. ABDOMEN:  Soft, nontender, nondistended. PELVIC:  She had normal-appearing external genitalia and normal- appearing vaginal mucosa and cervix.  No abnormal bleeding or discharge. Bimanual exam shows small uterus.  No palpable adnexal masses or tenderness.  ASSESSMENT AND PLAN:  This is a 20 year old nulligravida patient who is here for annual exam and contraception counseling.  Pap smear was performed today.  The patient will continue her birth control pills for now as she will be able to control the onset of her period, based on the placebo pills will be taken.  The patient will reschedule an appointment for ParaGard insertion as she is truly desires the ParaGard as a form of birth control as it does not contain any hormones.  The patient will be contacted with any abnormal results.          ______________________________ Catalina Antigua, MD    PC/MEDQ  D:  11/16/2010  T:  11/17/2010  Job:  [id number]

## 2015-06-02 LAB — ABO/RH: ABO/Rh: A POS

## 2015-06-02 LAB — ANTIBODY SCREEN: Antibody Screen: NEGATIVE

## 2015-12-01 NOTE — Discharge Summary (Signed)
 [facility] Patient Summary       ;        [facility]  [address] [place], [place] [address]  [phone]  Patient Discharge Instructions     Name: [name], [name]  Current Date: 12/01/2015 17:43:20  DOB: 1991-01-23 MRN: [id number] FIN:< %ALIASEFIN NBR%>  Patient Address: [address] [place] [address]  Patient Phone: [phone]  Primary Care Provider:  Name: Pcp, None  Phone:    Immunizations Provided:      Discharge Diagnosis: Adult hypothyroidism; Migraine; Nausea and vomiting in pregnancy  Discharged To:                               < %DTADISCHARGED TO%>  Home Treatments:                           TREATMENTS%>  Devices/Equipment:                      < %DTAHOME EQUIPMENT%>  Post Hospital Services: SERVICES%>  Professional Skilled Services:       SERVICES%>  Therapist, sports and Community Resources:                < %DTASPECIAL SERVICES AND COMMUNITY RESOURCES%>  Mode of Discharge Transportation:                              < %DTADISCHARGE TRANSPORTATION%>  Discharge Orders:         Discharge Patient 12/01/15 17:37:00 EDT         Comment:      Medications  During the course of your visit, your medication list was updated with the most current information. The details of those changes are reflected below:         Medications that have not changed  Other Medications  levothyroxine (Levoxyl 50 mcg (0.05 mg) oral tablet) Oral (given by mouth) every day.  Last Dose:____________________  multivitamin, prenatal (Classic Prenatal oral tablet) Oral (given by mouth) every day.  Last Dose:____________________         [facility] would like to thank you for allowing us  to assist you with your healthcare needs. The following includes patient education materials and information regarding your injury/illness.     Shimon, Zavannah has been given the following list of follow-up instructions, prescriptions, and patient education materials:  Follow-up Instructions               Type Location Start [facility]    Surgery MP Labor and Delivery 12/16/2015 07:00:00 12/16/2015 08:11:00 Confirmed                   It is important to always keep an active list of medications available so that you can share with other providers and manage your medications appropriately. As an additional courtesy, we are also providing you with your final active medications list that you can keep with you.           levothyroxine (Levoxyl 50 mcg (0.05 mg) oral tablet) Oral (given by mouth) every day.  multivitamin, prenatal (Classic Prenatal oral tablet) Oral (given by mouth) every day.      Take only the medications listed above. Contact your doctor prior to taking any medications not on this list.  Discharge instructions, if any, will  display below     Instructions for Diet: for Diet%>  Instructions for Supplements:< %DTAProvider Supplement Instructions%>  Instructions for Activity: for Activity%>  Instructions for Wound Care:< %DTAProvider Instructions for Wound Care%     Medication leaflets, if any, will display below         Patient education materials, if any, will display below        Diet for Vomiting or Diarrhea (Adult)      Once the vomiting stops, then...    During the first 12 to 24 hours   follow the diet below:    BEVERAGES: Plain water, sport drinks like Gatorade, soft drinks without caffeine; mineral water (plain or flavored); clear fruit juices, decaffeinated tea and coffee.    SOUPS: Clear broth, consomm, and bouillon    DESSERTS: Plain gelatin (Jell-O), popsicles and fruit juice bars. As you feel better, you may add 6-8 oz of yogurt per day.   During the next 24 hours   you may add the following to the above:    Hot cereal, plain toast, bread, rolls, crackers    Plain noodles, rice, mashed potatoes, chicken noodle or rice soup    Unsweetened canned fruit (avoid pineapple), bananas   Limit fat intake to less than 15 grams per day by avoiding margarine, butter, oils, mayonnaise, sauces, gravies, fried foods, peanut butter,  meat, poultry, and fish.   Limit fiber; avoid raw or cooked vegetables, fresh fruits (except bananas), and bran cereals.   Limit caffeine and chocolate. No spices or seasonings except salt.   During the next 24 hours   Gradually resume a normal diet, as you feel better and your symptoms lessen.      [address] [employer] [address] [place], [place] [address]. All rights reserved. This information is not intended as a substitute for professional medical care. Always follow your healthcare professional's instructions.               IS IT A STROKE?  Act FAST and Check for these signs:     FACE                  Does the face look uneven?     ARM                    Does one arm drift down?     SPEECH             Does their speech sound strange?     TIME                   Call 9-1-1 at any sign of stroke  Heart Attack Signs  Chest discomfort: Most heart attacks involve discomfort in the center of the chest and lasts more than a few minutes, or goes away and comes back. It can feel like uncomfortable pressure, squeezing, fullness or pain.  Discomfort in upper body: Symptoms can include pain or discomfort in one or both arms, back, neck, jaw or stomach.  Shortness of breath: With or without discomfort.  Other signs: Breaking out in a cold sweat, nausea, or lightheaded.  Remember, MINUTES DO MATTER. If you experience any of these heart attack warning signs, call 9-1-1 to get immediate medical attention!             Yes - Patient/Family/Caregiver demonstrates understanding of instructions given  ______________________________ ___________ ___________________ ___________  Patient/Family/ Caregiver Signature Date/Time  Provider Signature Date/Time

## 2015-12-16 NOTE — Op Note (Signed)
Operative Report    [place]  Gabriella Ben Ramon Dredge, MD  Service Date: 12/16/2015    PREOPERATIVE DIAGNOSIS:  Intrauterine pregnancy at 39 weeks, prior  C-section, desires repeat.      POSTOPERATIVE DIAGNOSIS:  Intrauterine pregnancy at 39 weeks, prior  C-section, desires repeat.      PROCEDURE:  Repeat low transverse C-section.      SURGEON:  Dr. Doristine Locks.      ASSISTANT:  [name], PA.      FINDINGS:  A viable female delivered at 8:14 weighing 8 pounds 14 ounces  with Apgars 9 at 1 minute and 9 at 5 minutes.  Normal tubes and  ovaries bilaterally.      COMPLICATIONS:  None.      SPECIMENS REMOVED:  Placenta, which was discarded.      ESTIMATED BLOOD LOSS:  600 mL.      CONDITION POSTOPERATIVELY:  Stable to recovery room.      PROCEDURE DETAILS:  The patient was taken to the operating room where  spinal anesthesia was found to be adequate.  She was placed in the  dorsal supine position with a leftward tilt and then prepped and  draped in the usual sterile fashion.  A surgical time-out was  performed.  A Pfannenstiel skin incision was made at the site of her  previous scar.  The rectus muscles were separated in the midline.  The  peritoneum which was densely scarred was entered sharply and then  extended bluntly and sharply.  The muscles were even separated further  superiorly as the scar tissue obviously was not very pliable and given  her history of a large baby, we wanted to make sure we had adequate  room.  The bladder blade was placed and vesicouterine peritoneum was  identified and the bladder flap was created with Metzenbaum scissors.   The bladder blade was then replaced and a transverse incision was made  in the lower uterine segment.  Initially the vertex was difficult to  get through the incision, but with 1 pull with the vacuum over about 2  seconds the head delivered, the shoulders were shortly after.  The  cord was clamped and cut and infant was handed off to awaiting nursery  staff.  Cord  blood was sent.  The placenta was delivered manually.   The uterus was exteriorized and cleared of all clots and debris.  The  hysterotomy was repaired in a running locked fashion followed by a  second imbricating layer.  The hysterotomy was packed and attention  was turned posteriorly where the cul-de-sac was copiously irrigated.   The uterus was then carefully returned to the abdomen.  The  hysterotomy inspected off tension.  A couple of superficial bleeders  were addressed with the Bovie and Surgicel was placed as she had a  thick lower uterine segment with just some denuded areas present  between the serosa and the hysterotomy.  The peritoneum was tagged  with Kelly clamps and then run with chromic suture.  The rectus  muscles were reapproximated inferiorly with 1 figure-of-eight suture.   The fascia was reapproximated from left lateral midline by Dr. Dorian Furnace  and right lateral midline by Erie Noe.  The wound was again irrigated.   Bleeders were cauterized with the Bovie.  A subcutaneous suture layer  was placed followed by subcuticular skin closure.  The patient  tolerated the procedure well.  Sponge, lap and needle counts were  correct times 2, and she returned  to the recovery room in stable  condition.      [name]  Gabriella Barefoot, MD  TR: *n DD: 12/16/2015 09:02 TD: 12/16/2015 09:51 Job#: [id number]  \\[id number]\\DOC#: [id number]  \\[id number]\\  Signature Line    Electronically Signed on 12/21/2015 07:50 AM EDT  ________________________________________________  [name],  Rubert Frediani W

## 2015-12-16 NOTE — Procedures (Signed)
IntraOp Record - MPLD             IntraOp Record - MPLD Summary                                                                   Primary Physician:        Laury Deep    Case Number:              [id number]    Finalized Date/Time:      12/16/15 11:21:45    Pt. Name:                 [name], [name] [facility]    D.O.B./Sex:               [birth year]    Female    Med Rec #:                [id number]    Physician:                Laury Deep    Financial #:              [id number]    Pt. Type:                 I    Room/Bed:                 2605/01    Admit/Disch:              12/16/15 06:12:00 -    Institution:       MPLD - Case Times                                                                                                         Entry 1                                                                                                          Patient      In Room Time             12/16/15 07:45:00               Out Room Time                   12/16/15 09:02:00    Anesthesia      Start  Time               12/16/15 07:45:00               Stop Time                       12/16/15 09:05:00    Procedure      Start Time               12/16/15 08:02:00               Stop Time                       12/16/15 08:50:00    Last Modified By:         Briscoe Deutscher                              12/16/15 09:48:22      MPLD - Case Times Audit                                                                          12/16/15 09:48:22         Owner: Rolla Etienne                               Modifier: HARTKE                                                            1     <*> Start Time                             12/16/15 09:45:00     12/16/15 09:47:29         Owner: Rolla Etienne                               Modifier: HARTKE                                                        <+> 1         Out Room Time        MPLD - Safety Checklist - Sign In  Entry 1                                                                                                          History/Physical on       Yes                             Procedure Consent               Yes    Chart                                                     on Chart     Site Marked (if           Yes    applicable)     Last Modified By:         Edwyna Shell RN, Tresa Endo S                              12/16/15 09:54:13      MPLD - Case Attendance                                                                                                    Entry 1                         Entry 2                         Entry 3                                          Case Attendee             BULLEN-MD,  LAURI W             HAJZUS-PA,  VANESSA ANN         CAMPBELL-MD,  STEPHEN    Role Performed            Surgeon Primary                 First Assistant                 Anesthesiologist    Time In  12/16/15 07:50:00               12/16/15 07:50:00               12/16/15 07:40:00    Time Out                  12/16/15 08:51:00               12/16/15 08:51:00               12/16/15 09:02:00    Procedure                 Cesarean Section                Cesarean Section                Cesarean Section    Last Modified By:         Edwyna Shell RN, Delphina Cahill, RN, Delphina Cahill, RNLandry Dyke                              12/16/15 09:45:49               12/16/15 09:45:49               12/16/15 09:45:49                                Entry 4                         Entry 5                         Entry 6                                          Case Attendee             Kaylyn Layer, RN, Purnell Shoemaker, RN, Ruben Gottron    Role Performed            Surgical Scrub                  Circulator                      Nursery Nurse    Time In                   12/16/15 07:30:00               12/16/15 07:45:00               12/16/15 07:45:00    Time Out                  12/16/15  09:15:00               12/16/15 09:02:00  12/16/15 08:50:00    Procedure                 Cesarean Section                Cesarean Section                Cesarean Section    Last Modified By:         Edwyna Shell RN, Delphina Cahill, RN, Delphina Cahill, RN, [facility] S                              12/16/15 09:45:49               12/16/15 09:45:49               12/16/15 09:45:49                                Entry 7                                                                                                          Case Attendee             Nonah Mattes SU    Role Performed            Respiratory Therapist    Time In                   12/16/15 07:50:00    Time Out                  12/16/15 08:17:00    Procedure                 Cesarean Section    Last Modified By:         Briscoe Deutscher                              12/16/15 09:45:49      MPLD - Case Attendance Audit                                                                     12/16/15 09:45:49         Owner: Rolla Etienne                               Modifier: Rolla Etienne  1     <+> Time In            1     <+> Time Out            1     <*> Procedure                              Cesarean Section        <+> 2         Case Attendee        <+> 2         Role Performed        <+> 2         Time In        <+> 2         Time Out        <+> 2         Procedure        <+> 3         Case Attendee        <+> 3         Role Performed        <+> 3         Time In        <+> 3         Time Out        <+> 3         Procedure        <+> 4         Case Attendee        <+> 4         Role Performed        <+> 4         Time In        <+> 4         Time Out        <+> 4         Procedure        <+> 5         Case Attendee        <+> 5         Role Performed        <+> 5         Time In        <+> 5         Time Out        <+> 5         Procedure        <+> 6         Case Attendee        <+> 6          Role Performed        <+> 6         Time In        <+> 6         Time Out        <+> 6         Procedure        <+> 7         Case Attendee        <+> 7         Role Performed        <+> 7  Time In        <+> 7         Time Out        <+> 7         Procedure        MPLD - Skin Assessment                                                                          Pre-Care Text:            A.240 Assesses baseline skin condition Im.120 Implements protective measures to prevent skin or tissue injury           due to mechanical sources  Im.280.1 Implements progective measures to prevent skin or tissue injury due to           thermal sources Im.360 Monitors for signs and symptons of infection                              Entry 1                                                                                                          Skin Integrity            Intact    Last Modified By:         Edwyna Shell RN, Landry Dyke                              12/16/15 09:54:39    Post-Care Text:            E.10 Evaluates for signs and symptoms of physical injury to skin and tissue E.270 Evaluate tissue perfusion           O.60 Patient is free from signs and symptoms of injury caused by extraneous objects   O.210 Patinet's tissue           perfusion is consistent with or improved from baseline levels      MPLD - Patient Positioning                                                                      Pre-Care Text:            A.240 Assesses baseline skin condition A.280 Identifies baseline musculoskeletal status A.280.1 Identifies           physical alterations that require additional precautions for procedure-specific positioning  A.510.8 Maintains           patient's dignity and privacy Im.120 Implements protective measures to prevent skin/tissue injury due to           mechanical sources Im.40 Positions the patient Im.80 Applies safety devices                              Entry 1                                                                                                           Procedure                 Cesarean Section                Body Position                   Left Uterine                                                                                              Displacement    Left Arm Position         Extended on Padded Arm          Right Arm Position              Extended on Padded Arm                              Board w/Security Strap                                          Board w/Security Strap    Left Leg Position         Extended Security Strap         Right Leg Position              Extended Security Strap    Feet Uncrossed            Yes                             Pressure Points                 Yes  Checked     Positioning Device        Uterine Displacement            Positioned By                   HART, RN, KELLY S,                              Roll, Head Rest Foam                                            CAMPBELL-MD,  STEPHEN    Outcome Met (O.80)        Yes    Last Modified By:         Edwyna Shell, RN, Landry Dyke                              12/16/15 09:54:05    Post-Care Text:            E.10 Evaluates for signs and symptoms of physical injury to skin and tissue E.290 Evaluates musculoskeletal           status O.80 Patient is free from signs and symptoms of injury related to positioning O.120 the patient is free           from signs and symptoms of injury related to transfer/transport  O.250 Patient's musculoskeletal status is           maintained at or improved from baseline levels      MPLD - Skin Prep                                                                                Pre-Care Text:            A.30 Verifies allergies A.20 Verifies procedure, surgical site, and laterality A.510.8 Maintains paritnet's           dignity and privacy Im.270 Performs Skin Preparation Im.270.1 Implements protective measures to prevent skin           and tissue injury due  to chemical sources  A.300.1 Protects from cross-contamination                              Entry 1                                                                                                          Hair Removal  Hair Removal By          Jonell Cluck A             Hair Removal Methods            Clipper     Hair Removal Site        Abdomen/Mons pubis              Hair Removal Site               Bilateral                                                              Details     Skin Prep      Prep Agents (Im.270)     Chlorhexidine Gluconate         Prep Area (Im.270)              Abdomen/Mons pubis                              2% w/Alcohol     Prep Area Details        Bilateral    Outcome Met (O.100)       Yes    Last Modified By:         Edwyna Shell RN, Tresa Endo S                              12/16/15 09:55:10    Post-Care Text:            E.10 Evaluates for signs and symptoms of physical injury to skin and tissue O.100 Patient is free from signs           and symptoms of chemical injury  O.740 The patient's right to privacy is maintained      MPLD - Skin Prep Audit                                                                           12/16/15 09:55:10         Owner: Rolla Etienne                               Modifier: HARTKE                                                        <+> 1         Hair Removal Methods        <+> 1         Hair Removal By        MPLD - Counts Initial and Final  Pre-Care Text:            A.20 Verifies operative procedure, sugical site, and laterality A.20.2 Assesses the risk for unintended           retained foreign body Im.20 Performs required counts                              Entry 1                                                                                                          Initial Counts      Initial Counts           JOHNSON,  CARMELA A,            Items included in               Instruments, Sponges,      Performed By             HART, RN, KELLY S               the Initial Count               Sharps    Final Counts      Final Counts             HART, RN, KELLY S,              Final Count Status              Correct     Performed By             Jonell Cluck A     Items Included in        Sponges, Sharps     Final Count     Outcome Met (O.20)        Yes    Last Modified By:         Edwyna Shell RN, Tresa Endo S                              12/16/15 09:51:30    Post-Care Text:            E.50 Evaluates results of the surgical count O.20 Patient is free from unintended retained foreign objects      MPLD - Counts Initial and Final Audit                                                            12/16/15 09:51:30         Owner: HARTKE                               Modifier:  HARTKE                                                        <+> 1         Initial Counts Performed By        <+> 1         Items included in the Initial Count        <+> 1         Final Count Status        MPLD - Counts Additional                                                                        Pre-Care Text:            A.20 Verifies operative procedure, sugical site, and laterality A.20.2 Assesses the risk for unintended           retained foreign body Im.20 Performs required counts                              Entry 1                         Entry 2                         Entry 3                                          Additional Count          Closing Count                   Relief Count                    Other/See Comments    Type     Additional Count          JOHNSON,  CARMELA A,            JOHNSON,  CARMELA A,            HART, RN, KELLY S,    Participants              HART, RN, KELLY S               JOHNSON,  CARMELA A             JOHNSON,  CARMELA A    Count Status              Correct                         Correct    Items Counted             Instruments, Sponges,           Instruments, Sponges,  Sponges, Tax adviser    Outcome Met (O.20)        Yes                             Yes                             Yes    Last Modified By:         Edwyna Shell, RN, Delphina Cahill, RN, Delphina Cahill, RN, KELLY S                              12/16/15 09:52:21               12/16/15 09:52:21               12/16/15 09:52:21                                Entry 4                                                                                                          Additional Count          Other/See Comments    Type     Additional Count          HART, RN, Landry Dyke,    Participants              Laural Benes,  CARMELA A    Count Status     Items Counted             Instruments, Sponges,                              Sharps    Outcome Met (O.20)        Yes    Last Modified By:         Edwyna Shell RN, Tresa Endo S                              12/16/15 09:52:21    Post-Care Text:            E.50 Evaluates results of the surgical count O.20 Patient is free from unintended retained foreign objects      MPLD -  General Case Data                                                                        Pre-Care Text:            A.350.1 Classifies surgical wound                              Entry 1                                                                                                          Case Information      ASA Class                2                               Case Level                      None     OR                       MP OB OR 01                     Specialty                       Gynecology and                                                                                              Obstetrics (SN)     Wound Class              2-Clean-Contaminated    Preop Diagnosis           repeat c/section    Last Modified By:         Briscoe Deutscher                              12/16/15 09:47:45    Post-Care Text:            O.760 Patient receives consistent and comparable care  regardless of the setting      MPLD - General Case  Data Audit                                                                   12/16/15 09:47:45         Owner: Rolla Etienne                               Modifier: HARTKE                                                        <+> 1         Preop Diagnosis        MPLD - Safety Checklist - Time Out                                                              Pre-Care Text:            A.10 Confirms patient identity A.20 Verifies operative procedure, surgical site, and laterality A.20.1 Verifies           consent for planned procedure A.30 Verifies allergies                              Entry 1                                                                                                          Surgical/Procedure        Yes                             Time Out Complete               12/16/15 08:00:00    Team confirms     correct patient,     correct site and     correct procedure     Last Modified By:         Edwyna Shell RN, Landry Dyke                              12/16/15 09:54:24    Post-Care Text:            E.30 Evaluates verification process for correct patient, site, side, and level surgery      MPLD - Cautery  Pre-Care Text:            A.240 Assesses baseline skin condition A280.1 Identifies baseline musculoskeletal status Im.50 Implements           protective measures to prevent injury due to electrical sources  Im.60 Uses supplies and equipment within safe           parameters Im.80 Applies safety devices                              Entry 1                                                                                                          ESU Type                  GENERATOR                       Identification                  [id number]                              COVIDIEN/[employer]LEYLAB              Number     Coag Setting (watts)      40                              Cut Setting (watts)              60    Grounding Pad             Yes                             Grounding Pad Site              Thigh, right    Needed?     Grounding Pad             HART, RN, KELLY S               Outcome Met (O.10)              Yes    Applied By     Last Modified By:         Edwyna Shell RN, Landry Dyke                              12/16/15 09:49:20    Post-Care Text:            E.10 Evaluates for signs and symptoms of physical injury to skin and tissue O.10 Patient is free from signs and           symptoms of injury related to thermal sources  O.70 Patient is  free from signs and symptoms of electrical injury      MPLD - Patient Care Devices                                                                     Pre-Care Text:            A.200 Assesses risk for normothermia regulation A.40 Verifies presence of prosthetics or corrective devices           Im.280 Implements thermoregulation measures Im.60 Uses supplies and equipment within safe parameters                              Entry 1                         Entry 2                                                                          Equipment Type            MACHINE SEQUENTIAL              MACHINE SEQUENTIAL                              COMPRESSION                     COMPRESSION    SCD Sleeve Site           Legs Bilateral                  Legs Bilateral    Equipment/Tag Number     Initiated Pre             Yes                             Yes    Induction     Last Modified By:         Edwyna Shell RN, Delphina Cahill, RN, KELLY S                              12/16/15 09:53:42               12/16/15 09:53:42    Post-Care Text:            E.10 Evaluates signs and symptoms of physical injury to skin and tissue O.60 Patient is free from signs and           symptoms of injury caused by extraneous objects      MPLD - Fetal Heart Tones  Entry 1                                                                                                           Baby A                    150    Last Modified By:         Edwyna Shell, RN, KELLY S                              12/16/15 09:53:13      MPLD - Surgical Fluid Management                                                                Pre-Care Text:            A.280 Verifies allergies A.310 Identifies factors associated with an increased risk for hemorrhage or fluid and           electrolyte imbalance Im.210 Administers prescribed solutions A.280.1 Implements protective measures to prevent           skin or tissue injury due to thermal sources                              Entry 1                                                                                                          Irrigant                  0.9% Normal Saline              Irrigant Volume In              1000 mL    Irrigant Volume Out       1000 mL                         Outcome Met (O.300)             Yes    Last Modified By:         Edwyna Shell, RN, Landry Dyke  12/16/15 09:56:33    Post-Care Text:            E.10 Evaluates for signs and symptoms of physical injury to skin and tissue O.10 Patient is free from signs and           symptoms of injury due to thermal sources O.100 Patient is free from signs and symptoms of chemical injury      MPLD - Specimens                                                                                                          Entry 1                                                                                                          Description               Placenta                        Specimen Type                   Fresh    Date/Time in              12/16/15 09:00:00    Formalin (for     breast tissue only)     Last Modified By:         Edwyna Shell RN, Tresa Endo S                              12/16/15 09:56:09      MPLD - Tubes, Drains, Catheters                                                                 Pre-Care Text:            A.310  Identifies factors associated with an increased risk for hemorrhage or fluid and electrolyte imbalance           Im.250 Administers care to invasive device sites                              Entry 1  Device Description        CATH FOLEY SURE STEP            Location                        Ureteral                              16FR 5CC W/                              DRAINAGE BAG    Location Detail           Bilateral    Last Modified By:         Edwyna Shell RN, Landry Dyke                              12/16/15 09:56:46    Post-Care Text:            E.340 Evaluates tubes and drains are intact and functioning as planned O.60 Patient is free from signs and           symptoms of injury caused by extraneous objects      MPLD - Dressing/Packing                                                                         Pre-Care Text:            A.350 Assesses susceptibility for infection Im.250 Administers care to invasive devices Im.290 Administer care           to wound sites  Im.300 Implements aseptic technique                              Entry 1                                                                                                          Site                      Abdomen                         Site Details                    Bilateral    Dressing Item     Details      Dressing Item            Island Dressing, Wound     (Im.290)  Closure Strip    Last Modified By:         Edwyna Shell RN, Landry Dyke                              12/16/15 09:53:04    Post-Care Text:            E.320 Evaluate factors associted with increased risk for postoperative infection at the completion of the           procedure O.200 Patient's wound perfusion is consistent with or improved from baseline levels  O.Patient is           free from signs and symptoms of infection      MPLD - Surgical Procedures                                                                       Pre-Care Text:            A.20 Verifies operative procedure, surgical site, and laterality Im.150 Develops individualized plan of care                              Entry 1                                                                                                          Procedure     Description      Procedure                Cesarean Section                Surgical Procedure              CS                                                              Text     Primary Procedure         Yes                             Primary Surgeon                 Laury Deep    Start                     12/16/15 08:02:00               Stop  12/16/15 08:50:00    Anesthesia Type           Spinal                          Surgical Service                Gynecology and                                                                                              Obstetrics (SN)    Wound Class               2-Clean-Contaminated    Last Modified By:         Briscoe Deutscher                              12/16/15 09:48:48    Post-Care Text:            O.730 The patinet's care is consistent with the individualized perioperative plan of care      MPLD - Safety Checklist - Sign Out                                                              Pre-Care Text:            Im.330 Manages specimen handling and disposition                              Entry 1                                                                                                          Patient Safety            Yes    Communication Guide     Used Throughout Case     Last Modified By:         Briscoe Deutscher                              12/16/15 09:54:31    Post-Care Text:            E.800 Ensures continuity of care E.50 Evaluates results of the surgical count O.30 Patient's procedure is  performed on the correct site, side, and level O.50 patient's current status is communicated  throughout the           continuum of care O.40 Patient's specimen(s) is managed in the appropriate manner      MPLD - Fire Risk Assessment                                                                                               Entry 1                                                                                                          Fire Risk                 Alcohol Based Prep              Fire Risk Score                 3    Assessment: If            Solution, Open Oxygen    checked, checkmark        Source, Ignition Source    = 1 point                 In Use    Last Modified By:         Edwyna Shell, RN, Tresa Endo S                              12/16/15 09:53:18      MPLD - Transfer                                                                                                           Entry 1  Transferred By            HART, RN, KELLY S,              Via                             Bed                              CAMPBELL-MD,  STEPHEN    Post-op Destination       OB PACU    Skin Assessment      Condition                Not Intact / Abnormality        Description                     Surgical insicion    Last Modified By:         Odie Sera S                              12/16/15 09:57:15      Case Comments                                                                                         <None>              Finalized By: Briscoe Deutscher      Document Signatures                                                                             Signed By:           Odie Sera S 12/16/15 09:57          HART, RN, Landry Dyke 12/16/15 11:21      Unfinalized History                                                                                     Date/Time            Username    Reason for Unfinalizing         Freetext Reason for Unfinalizing  12/16/15 11:20        HARTKE      Charging

## 2015-12-17 NOTE — Progress Notes (Signed)
 Pregnancy Summary Document     [name], [name]  DOB:  01/27/1991 Age:  25 Years MRN:  [id number]  --------------------------------------------------------------------------------------------------------------------------------------------  Pregnancy Summary    (Date of Report: 12/17/15)    G 2 P 1 (1,0,0,1)  Gestation: [place] LMP (from pt. history): --           EDD:  12/23/2015 EGA: 39 weeks              EDD/EGA Method: Unknown    --------------------------------------------------------------------------------------------------------------------------------------------   [name], [name]  DOB: [birth year]  Antepartum Note     No antepartum notes have been documented    --------------------------------------------------------------------------------------------------------------------------------------------   [name], [name]  DOB: May 10, 1991  Problems   (Active Problems Only)  Body mass index 30+ - obesity   (SNOMED CT: 746151988, Onset: 12/16/15)   Comment:  Problem added by Discern Expert  (SYSTEM,  SYSTEM on 12/16/15)   Uterine scar from previous surgery in pregnancy, childbirth and the puerperium (Related to Pregnant problem)  (SNOMED CT: 693571988, Onset: 12/01/15)   Comment:  Problem added by Discern Expert  (SYSTEM,  SYSTEM on 12/01/15)   Group B streptococcus (Related to Pregnant problem)  (SNOMED CT: 506381983, Onset: 12/01/15)   Comment:  Problem added by Discern Expert  (SYSTEM,  SYSTEM on 12/01/15)   Pregnant   (SNOMED CT: 808926986, Onset: 03/25/15)  Hypothyroid   (SNOMED CT: 507160980, Onset: --)  Cluster headache   (SNOMED CT: 702661985, Onset: --)  At risk for infection   (SNOMED CT: 786229980, Onset: --)   Comment:  Problem added automatically by system based on initiation of Risk For Infection Plan of Care                       (SYSTEM,  SYSTEM on 12/16/15)      --------------------------------------------------------------------------------------------------------------------------------------------   [name], [name]  DOB: 13-May-1991  Prenatal Visit Diagnosis   (Note:  All diagnoses documented since 03/25/2015)  12/16/15 - Maternal care for unspecified type scar from previous cesarean delivery  (ICD-10-CM: O34.219, Date: 12/16/15)  12/16/15 - [redacted] weeks gestation of pregnancy  (ICD-10-CM: Z3A.39, Date: 12/16/15)  12/16/15 - C-SECTION  (-- --, Date:         )  12/16/15 - REPEAT CS  (-- --, Date:         )  12/01/15 - Other specified disorders of amniotic fluid and membranes, third trimester, not applicable or unspecified  (ICD-10-CM: N58.1K69, Date:         )  12/01/15 - Other specified disorders of amniotic fluid and membranes, third trimester, not applicable or unspecified  (ICD-10-CM: N58.1K69, Date:         )  12/01/15 - [redacted] weeks gestation of pregnancy  (ICD-10-CM: Z3A.36, Date:         )  12/01/15 - Carrier of Group B streptococcus  (ICD-10-CM: Z22.330, Date:         )  12/01/15 - Migraine with aura, not intractable, without status migrainosus  (ICD-10-CM: G43.109, Date:         )  12/01/15 - Hypothyroidism, unspecified  (ICD-10-CM: E03.9, Date:         )  12/01/15 - Endocrine, nutritional and metabolic diseases complicating pregnancy, third trimester  (ICD-10-CM: N00.716, Date:         )  12/01/15 - Other specified disorders of amniotic fluid and membranes, third trimester, not applicable or unspecified  (ICD-10-CM: O41.8X30, Date:         )  12/01/15 - Maternal care for unspecified type scar from previous cesarean delivery  (ICD-10-CM: O34.219, Date:         )  12/01/15 - Nausea  (ICD-10-CM: R11.0, Date:         )  12/01/15 - Diarrhea, unspecified  (ICD-10-CM: R19.7, Date:         )  12/01/15 - Other specified pregnancy related conditions, third trimester  (ICD-10-CM: O26.893, Date:         )  12/01/15 - LABOR CHECK  (-- --, Date:         )  12/01/15 - LABOR CHECK   (-- --, Date:         )  12/01/15 - Vomiting of pregnancy, unspecified  (ICD-10-CM: O21.9, Date: 12/01/15)  12/01/15 - Hypothyroidism, unspecified  (ICD-10-CM: E03.9, Date: 12/01/15)  12/01/15 - Migraine, unspecified, not intractable, without status migrainosus  (ICD-10-CM: G43.909, Date: 12/01/15)  07/08/15 - Cardiac murmur, unspecified  (ICD-10-CM: R01.1, Date:         )  07/08/15 - Cardiac murmur, unspecified  (ICD-10-CM: R01.1, Date:         )  07/08/15 - Cardiac murmur, unspecified  (ICD-10-CM: R01.1, Date:         )  07/08/15 - [redacted] weeks gestation of pregnancy  (ICD-10-CM: Z3A.16, Date:         )  07/08/15 - Diseases of the circulatory system complicating pregnancy, second trimester  (ICD-10-CM: O99.412, Date:         )  07/08/15 - Cardiac murmur, unspecified  (ICD-10-CM: R01.1, Date:         )  07/08/15 - Rheumatic tricuspid insufficiency  (ICD-10-CM: I07.1, Date:         )  07/08/15 - Cardiac murmur, unspecified  (ICD-10-CM: R01.1, Date:         )  07/08/15 - Cardiac murmur, unspecified  (ICD-10-CM: R01.1, Date:         )  07/08/15 - Cardiac murmur, unspecified  (ICD-10-CM: R01.1, Date:         )  07/08/15 - Cardiac murmur, unspecified  (ICD-10-CM: R01.1, Date:         )  07/08/15 - [redacted] weeks gestation of pregnancy  (ICD-10-CM: Z3A.16, Date:         )  07/08/15 - Diseases of the circulatory system complicating pregnancy, second trimester  (ICD-10-CM: O99.412, Date:         )  07/08/15 - Cardiac murmur, unspecified  (ICD-10-CM: R01.1, Date:         )  07/08/15 - Rheumatic tricuspid insufficiency  (ICD-10-CM: I07.1, Date:         )  07/08/15 - Cardiac murmur, unspecified  (ICD-10-CM: R01.1, Date:         )  06/01/15 - Encounter for supervision of other normal pregnancy, first trimester  (ICD-10-CM: Z34.81, Date: 06/01/15)  06/01/15 - Encounter for supervision of other normal pregnancy, first trimester  (ICD-10-CM: Z34.81, Date:         )  06/01/15 - Encounter for supervision of other normal pregnancy, first  trimester  (ICD-10-CM: Z34.81, Date:         )    --------------------------------------------------------------------------------------------------------------------------------------------   [name]  DOB: [birth year]  Risk Factors and Genetic Screening  Risk Factors (Current Pregnancy)  Risk Factors:  Group B Streptococcus, Previous uterine scarring, Relative BMI greater than 30, Group B Streptococcus, Previous uterine scarring  Risk Factors Comments:  --    Ethnic Screening  No ethnicities have been recorded.    Genetic Disorders Screening        No genetic disorders have been recorded.    --------------------------------------------------------------------------------------------------------------------------------------------   Gabriella, [name]  DOB: 04-26-1991  Gestational Age (EGA) and EDD     * Note: EGA calculated as of 12/17/2015    EDD: 12/23/2015 EGA*: 39 weeks            Type: Authoritative Method Date: 12/01/2015    Method: Unknown (12/01/2015)  Confirmation: Confirmed  Description: --  Comments: --  Entered byBETHA Pounds, RN, Arland HERO on 12/01/2015     Other EDD Calculations for this Pregnancy:   No additional EDD calculations have been recorded for this pregnancy    --------------------------------------------------------------------------------------------------------------------------------------------   Tiu, [name]  DOB: Nov 19, 1990  Measurements  Pre-Pregnancy Weight: 92.07 kg    12/16/15 (39 weeks)  Recent Weight Measured: 101.6 kg (+9)    12/16/15 (39 weeks)  Height/Length Measured: 162.5 cm    12/16/15 (39 weeks)  Body Mass Index Measured: 38.48 kg/m2    12/16/15 (39 weeks)    --------------------------------------------------------------------------------------------------------------------------------------------   Baus, [name]  DOB: 1990-12-01  Prenatal Exam and Notes    Date EGA FunHt PTL S/S Cervix BP Weight Urine Baby     cm  Dil Eff(%) Sta mmHg lbs  kg Gluc Prot  FHR Fetal Activity Fet Pres   12/16/15 [redacted]w[redacted]d --       --      --     --   --  135/88 222.7101 --         --         A       = 155bpm  -- --   12/01/15 [redacted]w[redacted]d --       *Pelv.. 0.5    40%  -2  130/84 218.399 --         --         A       = 150bpm  -- --                       * Preterm Labor Signs & Symptoms  12/01/2015 Pelvic pressure    --------------------------------------------------------------------------------------------------------------------------------------------   Desrochers, [name]  DOB: 11-03-1990  Physical Exams  Physical Exam   (12/17/15)   Respiratory    Oxygen Therapy:  Room air    SpO2:  98 %   Gestational Hypertension Evaluation    SBP/DBP Cuff:  106 mmHg/64 mmHg    --------------------------------------------------------------------------------------------------------------------------------------------   [name], [name]  DOB: January 26, 1991  Blood Types and Anti-D Immune Globulin  Blood Type and Screen  Mother ABO/Rh:  --  Mother Antibody Screen:  --  Father of Baby ABO/Rh:  --  Father of Baby Antibody Screen:  --    Anti-D Immune Globulin  Rho(D) Initial Status:  --  Rho(D) 28 Week Status:  --  Rho(D) Dates Given:  --    --------------------------------------------------------------------------------------------------------------------------------------------   Hufnagle, [name]  DOB: 1991-01-15  Prenatal Tests and Lab Results  Results ending with 'TR' have been keyed in by the practice or interpreted manually.  Result Date: Estimated weeks post pregnancy onset will display next to actual result date.      Test Result Date Ref Range    EABORh Interp  (U) A POS   06/01/15  (10 weeks)      ECABS Interp  Negative  06/01/15  (10 weeks)  Test Result Date Ref Range    Strep Grp B  (A) Positive   11/18/15  (35 weeks)  Negative    Glucose U POC  (N) NEGATIVE mg/dL  94/83/82  (36 weeks)      Blood U POC  Negative  12/01/15  (36 weeks)   Negative    pH U POC  6.0  12/01/15  (36 weeks)   (4.5 - 8.0)     Protein U POC  Negative  12/01/15  (36 weeks)       Leuk Est U POC  Negative  12/01/15  (36 weeks)       Ketones U POC  Negative mg/dL  94/83/82  (36 weeks)       Bili U POC  Negative  12/01/15  (36 weeks)   Negative    Color U POC  (N) YELLOW   12/01/15  (36 weeks)      Urobilin U POC  0.2 EU/dL  94/83/82  (36 weeks)       Spec Grav U POC  1.015  12/01/15  (36 weeks)   (1.003 - 1.035)    Nitrite U POC  Negative  12/01/15  (36 weeks)   Negative    Appear U POC  (N) CLEAR   12/01/15  (36 weeks)      Amniotic Fl Prot  Negative  12/01/15  (36 weeks)       Nitrite U POC  Negative  12/16/15  (39 weeks)   Negative    Blood U POC  Negative  12/16/15  (39 weeks)   Negative    Glucose U POC  (N) NEGATIVE mg/dL  94/68/82  (39 weeks)      pH U POC  6.0  12/16/15  (39 weeks)   (4.5 - 8.0)    Spec Grav U POC  1.020  12/16/15  (39 weeks)   (1.003 - 1.035)    Ketones U POC  (A) 40 mg/dL  94/68/82  (39 weeks)      Urobilin U POC  0.2 EU/dL  94/68/82  (39 weeks)       Protein U POC  (A) Trace   12/16/15  (39 weeks)      Color U POC  (N) YELLOW   12/16/15  (39 weeks)      Leuk Est U POC  Negative  12/16/15  (39 weeks)       Appear U POC  (N) CLEAR   12/16/15  (39 weeks)      Bili U POC  (A) Small   12/16/15  (39 weeks)  Negative    RBC  4.72 x10e6/mcL  12/16/15  (39 weeks)   (3.60 - 5.20)    MCHC  (L) 31.9 g/dL  94/68/82  (39 weeks)  (32.0 - 36.0)    MCV  (L) 76.6 fL  12/16/15  (39 weeks)  (81.0 - 99.0)    RDW  14.7 %  12/16/15  (39 weeks)   (11.0 - 16.0)    WBC  (H) 11.2 x10e3/mcL  12/16/15  (39 weeks)  (3.8 - 10.6)    MPV  8.7 fL  12/16/15  (39 weeks)   (7.2 - 11.2)    Hgb  11.6 g/dL  94/68/82  (39 weeks)   (11.5 - 15.7)    MCH  (L) 24.5 pg  12/16/15  (39 weeks)  (27.0 - 34.5)    HCT  36.2 %  12/16/15  (39 weeks)   (34.0 - 47.0)  Platelet  217 x10e3/mcL  12/16/15  (39 weeks)   (140 - 440)        --------------------------------------------------------------------------------------------------------------------------------------------   Wyndham, [name]  DOB: Jul 19, 1990  Allergies    (Active and Proposed Allergies Only)  cephalosporins   (Severity: Moderate, Onset: Unknown)   Reactions: Hives    --------------------------------------------------------------------------------------------------------------------------------------------   Ryer, [name]  DOB: February 01, 1991  Medications  Prescriptions and Home Medications   Currently Active or Taking    Classic Prenatal oral tablet (Historically recorded)     SIG: tabs, Oral, Daily, 0 Refill(s)     Ordered: 12/01/15 Provider: --    Levoxyl 50 mcg (0.05 mg) oral tablet (Historically recorded)     SIG: mcg, tabs, Oral, Daily, 0 Refill(s)     Ordered: 12/01/15 Provider: --    Tylenol Cold Multi-Symptom Daytime (Historically recorded)     SIG: Oral, q4hr, PRN: allergy symptoms, 0 Refill(s)     Ordered: 12/16/15 Provider: --     Inactive or Suspended   (Prescriptions and documented medications since 12/23/14)    No medications have been recorded    Medication Administrations In-Office/Hospital   (All in-office/hospital administrated medications ordered since 12/23/14)   bupivacaine 0.75%-D8.25% (bupivacaine)    UBC, Once    Status: Completed Ordered: 12/16/15 Provider: HUBBARD ROERS   clindamycin    UBC, Once    Status: Completed Ordered: 12/16/15 Provider: CONTRIBUTOR_SYSTEM,  PYXIS   clindamycin IVPB (clindamycin)    900 mg, 50 mL, 100 mL/hr, IV Piggyback, On Call    Status: Completed Ordered: 12/16/15 Provider: BULLEN-MD,  LAURI W   fentaNYL (fentanyl)    UBC, Once    Status: Completed Ordered: 12/16/15 Provider: HUBBARD,  PYXIS   fentaNYL range dosing (fentaNYL 50 mcg/mL Inj Soln 2 mL)    50 mcg, 1 mL, IV Push, q5min, PRN: other (see comment)    Status: Completed Ordered: 12/16/15 Provider: CAMPBELL-MD,  STEPHEN   gentamicin IVPB +  Sodium Chloride 0.9% 100 mL (gentamicin + Sodium Chloride 0.9% intravenous solution 100 mL)    400 mg, 10 mL, 24.96 mL/hr, IV Piggyback, On Call    Status: Completed Ordered: 12/16/15 Provider: TRIXIE JANANN ORN   ketorolac (ketorolac 15 mg/mL Inj Soln 1 mL)    15 mg, 1 mL, IV Push, q6hr    Status: Completed Ordered: 12/16/15 Provider: BULLEN-MD,  LAURI W   Lactated Ringers Injection 1,000 mL (Lactated Ringers Injection solution 1,000 mL)    1,000 mL/hr, IV Bolus, Stop: 12/16/15 7:16:00 EDT    Status: Completed Ordered: 12/16/15 Provider: TRIXIE JANANN ORN   measles/mumps/rubella virus vaccine (measles/mumps/rubella virus vaccine Subcut Inj)    0.5 mL, Subcutaneous, At Discharge, PRN: other (see comment)    Status: Completed Ordered: 12/16/15 Provider: TRIXIE JANANN ORN   midazolam    UBC, Once    Status: Completed Ordered: 12/16/15 Provider: HUBBARD ROERS   morphine    UBC, Once    Status: Completed Ordered: 12/16/15 Provider: CONTRIBUTOR_SYSTEM,  PYXIS   ondansetron    UBC, Once    Status: Completed Ordered: 12/16/15 Provider: CONTRIBUTOR_SYSTEM,  PYXIS   oxytocin    UBC, Once    Status: Completed Ordered: 12/16/15 Provider: HUBBARD ROERS   oxytocin IV additive 30 units + Premix Sodium Chloride 0.9% 500 mL (oxytocin 30 units + Premix Sodium Chloride 0.9% 500 mL)    500 mL/hr, IV Bolus, Stop: 12/16/15 11:13:00 EDT    Status: Completed Ordered: 12/16/15 Provider: TRIXIE JANANN ORN  oxytocin IV additive 30 units + Premix Sodium Chloride 0.9% 500 mL (oxytocin 30 units + Premix Sodium Chloride 0.9% 500 mL)    500 mL/hr, IV Bolus, Stop: 12/16/15 7:16:00 EDT    Status: Completed Ordered: 12/16/15 Provider: TRIXIE JANANN ORN   phenylephrine    UBC, Once    Status: Completed Ordered: 12/16/15 Provider: CONTRIBUTOR_SYSTEM,  PYXIS   sodium chloride 0.9% (sodium chloride flush)    UBC, Once    Status: Completed Ordered: 12/17/15 Provider: CONTRIBUTOR_SYSTEM,  PYXIS   Bicitra (citric  acid-sodium citrate 334 mg-500 mg/5 mL Oral Soln 30 mL)    15 mL, Oral, PRE-OP, PRN: other (see comment)    Status: Discontinued Ordered: 12/16/15 Provider: TRIXIE JANANN ORN   carboprost POST DELIVERY ONLY (carboprost 250 mcg/mL Inj Soln 1 mL)    250 mcg, 1 mL, IM, Once, PRN: bleeding    Status: Discontinued Ordered: 12/16/15 Provider: TRIXIE JANANN ORN   lidocaine 1% injectable solution (lidocaine 1% Inj Soln 50 mL)    50 mL, IM, On Call, PRN: perineal discomfort    Status: Discontinued Ordered: 12/16/15 Provider: TRIXIE JANANN ORN   methylergonovine POST DELIVERY ONLY (methylergonovine 0.2 mg/mL Inj Soln 1 mL)    0.2 mg, 1 mL, IM, Once, PRN: bleeding    Status: Discontinued Ordered: 12/16/15 Provider: TRIXIE JANANN ORN   methylergonovine POST DELIVERY ONLY (methylergonovine 0.2 mg Tab)    0.2 mg, 1 tabs, Oral, q4hr, PRN: bleeding    Status: Discontinued Ordered: 12/16/15 Provider: TRIXIE JANANN ORN   misoprostol POST DELIVERY ONLY (misoprostol 200 mcg Tab)    800 mcg, 4 tabs, PR, Once, PRN: bleeding    Status: Discontinued Ordered: 12/16/15 Provider: BULLEN-MD,  LAURI W   NS 1,000 mL (Sodium Chloride 0.9% intravenous solution 1,000 mL)    Titrate, IV    Status: Discontinued Ordered: 12/16/15 Provider: BULLEN-MD,  LAURI W   oxytocin IV additive 30 units + Premix Sodium Chloride 0.9% 500 mL (oxytocin 30 units + Premix Sodium Chloride 0.9% 500 mL)    TITRATE, IV    Status: Discontinued Ordered: 12/16/15 Provider: BULLEN-MD,  LAURI W   oxytocin POST DELIVERY ONLY (oxytocin 10 units/mL Inj Soln 1 mL)    10 units, 1 mL, IM, Once, PRN: bleeding    Status: Discontinued Ordered: 12/16/15 Provider: BULLEN-MD,  LAURI W   Pepcid (famotidine 10 mg/mL IV Soln 2 mL)    20 mg, 2 mL, IV Push, Once, PRN: other (see comment)    Status: Discontinued Ordered: 12/16/15 Provider: TRIXIE JANANN ORN   Reglan (metoclopramide 5 mg/mL Inj Soln 2 mL)    10 mg, 2 mL, IV Push, Once, PRN: other (see comment)    Status:  Discontinued Ordered: 12/16/15 Provider: TRIXIE JANANN ORN   Zofran (ondansetron 4 mg Tab)    8 mg, 2 tabs, Oral, q8hr, PRN: nausea/vomiting    Status: Discontinued Ordered: 12/01/15 Provider: LORREN SAYRES R   ammonia aromatic solution (ammonia aromatic Solution)    1 EA, Inhale, q2min, PRN: other (see comment)    Status: Ordered Ordered: 12/16/15 Provider: TRIXIE JANANN ORN   carboprost POST DELIVERY ONLY (carboprost 250 mcg/mL Inj Soln 1 mL)    250 mcg, 1 mL, IM, Once, PRN: bleeding    Status: Ordered Ordered: 12/16/15 Provider: BULLEN-MD,  LAURI W   Colace (docusate sodium 100 mg Cap)    100 mg, 1 caps, Oral, BID    Status: Ordered Ordered: 12/16/15 Provider:  BULLEN-MD,  LAURI W   Dermoplast 20% topical spray (benzocaine 20% Topical Spray 56 gm)    1 sprays, Topical, At Discharge, PRN: perineal discomfort    Status: Ordered Ordered: 12/16/15 Provider: TRIXIE JANANN ORN   diphenhydrAMINE (diphenhydrAMINE 25 mg Cap)    25 mg, 1 caps, Oral, q6hr, PRN: itching/allergic reaction    Status: Ordered Ordered: 12/16/15 Provider: TRIXIE JANANN ORN   diphenhydrAMINE (diphenhydrAMINE 50 mg/mL Inj Soln 1 mL)    25 mg, 0.5 mL, IM, q6hr, PRN: itching/allergic reaction    Status: Ordered Ordered: 12/16/15 Provider: TRIXIE JANANN ORN   diphenhydrAMINE (diphenhydrAMINE 50 mg/mL Inj Soln 1 mL)    25 mg, 0.5 mL, IV, q6hr, PRN: itching/allergic reaction    Status: Ordered Ordered: 12/16/15 Provider: TRIXIE JANANN ORN   Dulcolax Laxative (bisacodyl 10 mg Rectal Supp)    10 mg, 1 supp, PR, At Discharge, PRN: constipation    Status: Ordered Ordered: 12/16/15 Provider: TRIXIE JANANN ORN   hydrocortisone 2.5% rectal cream with applicator (hydrocortisone 2.5% Rectal Cream 28 gm)    1 app, Topical, QID, PRN: hemorrhoid discomfort    Status: Ordered Ordered: 12/16/15 Provider: TRIXIE JANANN ORN   ibuprofen (ibuprofen 800 mg Tab)    800 mg, 1 tabs, Oral, q8hr, PRN: other (see comment)    Status: Ordered Ordered:  12/16/15 Provider: TRIXIE JANANN ORN   lidocaine 1% injectable solution (lidocaine 1% Inj Soln 50 mL)    50 mL, IM, On Call, PRN: perineal discomfort    Status: Ordered Ordered: 12/16/15 Provider: TRIXIE JANANN ORN   LR 1,000 mL (Lactated Ringers Injection solution 1,000 mL)    125 mL/hr, IV    Status: Ordered Ordered: 12/15/15 Provider: BULLEN-MD,  LAURI W   methylergonovine POST DELIVERY ONLY (methylergonovine 0.2 mg/mL Inj Soln 1 mL)    0.2 mg, 1 mL, IM, Once, PRN: bleeding    Status: Ordered Ordered: 12/16/15 Provider: BULLEN-MD,  LAURI W   methylergonovine POST DELIVERY ONLY (methylergonovine 0.2 mg Tab)    0.2 mg, 1 tabs, Oral, q4hr, PRN: bleeding    Status: Ordered Ordered: 12/16/15 Provider: TRIXIE JANANN ORN   misoprostol POST DELIVERY ONLY (misoprostol 200 mcg Tab)    800 mcg, 4 tabs, PR, Once, PRN: bleeding    Status: Ordered Ordered: 12/16/15 Provider: TRIXIE JANANN ORN   morphine (morphine 4 mg/mL preservative-free Sol)    2 mg, 0.5 mL, IV, q1hr, PRN: other (see comment)    Status: Ordered Ordered: 12/16/15 Provider: CAMPBELL-MD,  STEPHEN   NS 1,000 mL (Sodium Chloride 0.9% intravenous solution 1,000 mL)    Titrate, IV    Status: Ordered Ordered: 12/16/15 Provider: BULLEN-MD,  LAURI W   ondansetron (ondansetron 2 mg/mL Inj Soln 2 mL)    4 mg, 2 mL, IV Push, q6hr, PRN: nausea    Status: Ordered Ordered: 12/16/15 Provider: TRIXIE JANANN ORN   ondansetron (ondansetron 2 mg/mL Inj Soln 2 mL)    4 mg, 2 mL, IV Push, Once, PRN: nausea    Status: Ordered Ordered: 12/16/15 Provider: CAMPBELL-MD,  STEPHEN   ondansetron (ondansetron 2 mg/mL Inj Soln 2 mL)    4 mg, 2 mL, IV Push, q6hr, PRN: nausea    Status: Ordered Ordered: 12/16/15 Provider: CAMPBELL-MD,  STEPHEN   oxytocin IV additive 30 units + Premix Sodium Chloride 0.9% 500 mL (oxytocin 30 units + Premix Sodium Chloride 0.9% 500 mL)    TITRATE, IV    Status:  Ordered Ordered: 12/16/15 Provider: TRIXIE JANANN ORN   oxytocin POST DELIVERY ONLY  (oxytocin 10 units/mL Inj Soln 1 mL)    10 units, 1 mL, IM, Once, PRN: bleeding    Status: Ordered Ordered: 12/16/15 Provider: TRIXIE JANANN ORN   Percocet 5/325 oral tablet range dose (acetaminophen-oxyCODONE 325 mg-5 mg Tab)    2 tabs, 1-2 tabs, Oral, q4hr, PRN: other (see comment)    Status: Ordered Ordered: 12/16/15 Provider: TRIXIE JANANN ORN   promethazine (promethazine 25 mg Tab)    25 mg, 1 tabs, Oral, q6hr, PRN: nausea    Status: Ordered Ordered: 12/16/15 Provider: TRIXIE JANANN ORN   promethazine (promethazine 25 mg/mL Inj Soln 1 mL)    25 mg, 1 mL, IM, q6hr, PRN: nausea    Status: Ordered Ordered: 12/16/15 Provider: TRIXIE JANANN ORN   promethazine (promethazine 25 mg Rectal Supp)    25 mg, 1 supp, PR, q6hr, PRN: nausea    Status: Ordered Ordered: 12/16/15 Provider: TRIXIE JANANN ORN   simethicone range dose (simethicone 80 mg Chew Tab)    160 mg, 2 tabs, Oral, q4hr, PRN: gas    Status: Ordered Ordered: 12/16/15 Provider: TRIXIE JANANN ORN   tetanus/diphth/pertuss (Tdap) adult/adol (tetanus/diphth/pertuss (Tdap) adult/adol 5 units-2.5 units-18.5 mcg/0.5 mL IM Susp 0.5 mL)    0.5 mL, IM, Once, PRN: other (see comment)    Status: Ordered Ordered: 12/16/15 Provider: TRIXIE JANANN ORN    --------------------------------------------------------------------------------------------------------------------------------------------   Gabriella, [name]  DOB: 1990-09-25  Immunizations  tetanus/diphth/pertuss (Tdap) adult/adol   10/12/15 (29 weeks)    --------------------------------------------------------------------------------------------------------------------------------------------   [name]  DOB: 1990/08/24  Menstrual History  Last Recorded Menstrual Period:  --  Last Menstrual Period Description:  --  Menarche Onset:  --  Menarche Frequency:  --  Menarche Length:  --  Date of Menses Prior to LMP:  --  On Hormonal Contracept within 2 months of LMP:  Yes  Date of Home Pregnancy Test:   --  Comments:  --    --------------------------------------------------------------------------------------------------------------------------------------------   Truby, Lolitha West  DOB: 1990-09-02  Pregnancy History   G2 P1(1,0,0,1)       Pregnancy # 1          Baby 1 Outcome Date: 12/24/2013 Neonate Outcome: Live Birth   Outcome or Result: C-Section   Gender: Female Gest Age: 1 weeks              Wt:  4167 g   Hospital: -- [facility] Labor: --   Child's Name: -- Baby's Father: --   Comment: FAILURE TO PROGRESS    --------------------------------------------------------------------------------------------------------------------------------------------   [name]  DOB: Mar 22, 1991  Medical History  Past Medical History   No active past medical history has been recorded    Family History   Grandparent (Name not documented, Alive)   Breast cancer   Heart disease   Malignant tumor of lung    Procedure or Surgical History   Cesarean delivery only;   Age: 45 Years Date: 12/16/2015   Cesarean Section   Age: 49 Years Date: 12/16/2015   Comment:   auto-populated from documented surgical case (HART, RN, KELLY S on 12/16/15)   Cesarean section   Age: 18 Years Date: 2015   Tonsillectomy   Age: 60 Years Date: 2010    --------------------------------------------------------------------------------------------------------------------------------------------   JAMMIE, TROUP West  DOB: [birth year]  Social & Psychosocial History  Social History   Alcohol     Past, Wine, 1-2  times per month   Denies     Substance Abuse     Denies   Denies     Tobacco     Never smoker   Never smoker    Psychosocial History   Family/Social    (12/16/15)    Father of Baby Involved: Yes      (12/01/15)    Father of Baby Involved: Yes      --------------------------------------------------------------------------------------------------------------------------------------------   Marcantel, Dari West  DOB: [birth year]  Infection History     Infection history  negative or not recorded    --------------------------------------------------------------------------------------------------------------------------------------------   Toelle, Verlinda West  DOB: Dec 28, 1990  Anesthesia and Transfusions  Prior Anesthesia or Transfusion Received:  Prior general anesthesia, No prior transfusion  Prior Anesthesia Reaction(s):  None  Prior Transfusion Reaction(s):  --  Blood Transfusion Acceptable to Patient:  Yes   --------------------------------------------------------------------------------------------------------------------------------------------   Winward, Aftin West  DOB: 14-Dec-1990  Birth Plan and Patient Requests  Prenatal Education: Yes, with previous pregnancy  Written Birth Plan: No  Written Birth Plan Location: --  Oral Intake OB: --  Support Person/Coach Relationship to Pt: Husband  Labor Preferences: --  Non-Medicinal Pain Relief: --  Infant Feeding: Exclusive formula  Anesthesia/Pain Medication During Labor: Spinal  Delivery Plan: --  Circumcision: Before discharge  Baby For Adoption: No  Patient Requests: --  Pediatrician Selected: BIAGIO IHA  Surrogate Pregnancy: No  Support Person's Name: Jillisa Harris    --------------------------------------------------------------------------------------------------------------------------------------------   Floyd Hill, Meshelle West  DOB: 12-Feb-1991  Education  1st Trimester  Alcohol Use --                                                                          Weight Gain --                                                                          Childbirth Classes --                                                                          Domestic Violence --                                                                          Drug Use --  Environmental Hazards --                                                                          Exercise --                                                                           Expected Course of Prenatal Care --                                                                          HIV Testing --                                                                          Hospital Facilities --                                                                          Influenza Vaccine --                                                                          Nutrition Counseling Verbalizes understanding                                                 (12/01/15 GLENWOOD Pounds, RN, Arland HERO)  Risk Factors Identified by History --                                                                          Routine Prenatal Tests --  Safety Verbalizes understanding                                                 (12/16/15 GLENWOOD LOUDER, RN, DEBORAH A)  Seat Belts --                                                                          Sexual Activity --                                                                          Smoking/Tobacco Use --                                                                          Social Service --                                                                          Special Diet Counseling --                                                                          Toxoplasmosis Precautions --                                                                          Travel --                                                                          Ultrasound --  Use of Any Medications --                                                                          VBAC Counseling --                                                                          When to Call Health Care Provider --                                                                           Work Hazards --                                                                            2nd Trimester  Abnormal Lab Values --                                                                        Childbirth Classes --                                                                        Choosing a Newborn Care Provider --                                                                        Domestic Violence --  Influenza Vaccine --                                                                        Postpartum Family Planning --                                                                        Postpartum Tubal Sterilization --                                                                        Premature Labor Signs and Symptoms --                                                                        Safety Verbalizes understanding                                               (12/16/15 GLENWOOD LOUDER, RN, DEBORAH A)  Smoking/Tobacco Use --                                                                        Social Service --                                                                          3rd Trimester  Breast or Bottle Feeding --                                                                        Analgesia Plans --  Anesthesia Plans --                                                                        Psychiatrist --                                                                        Childbirth Classes --                                                                        Circumcision --                                                                        Disability Forms --                                                                        Domestic Violence --                                                                         Family Medical Leave Forms --                                                                        Fetal Movement Monitoring --                                                                        Influenza Vaccine --  Labor Signs --                                                                        Newborn Education --                                                                        Postpartum Depression --                                                                        Postterm Counseling --                                                                        Safety Verbalizes understanding                                               (12/16/15 GLENWOOD LOUDER, RN, DEBORAH A)  Signs, Symptoms of PIH --                                                                        Smoking/Tobacco Use --                                                                        Social Service --                                                                        VBAC Counseling --  Prenatal Educational Materials/Leaflets Provided  Title/Topic     Date Provided  DIET, Vomiting or Diarrhea [67yr-Adult]     12/01/15    L&D Patient Education  Activity Expectations Verbalizes understanding                                               (12/16/15 GLENWOOD LOUDER, RN, [facility] A)  Activity Restrictions --                                                                        Weight Gain --                                                                        Analgesia Plans --                                                                        Anesthesia Plans --                                                                        Birth Plan --                                                                        Contractions Verbalizes understanding                                                (12/01/15 GLENWOOD Pounds, RN, Arland HERO)  Diagnostic Results --                                                                        Domestic Violence --  Drug to Drug Interactions --                                                                        Drug to Food Interactions --                                                                        Equipment/Devices --                                                                        Fetal Monitoring Verbalizes understanding                                               (12/01/15 GLENWOOD Pounds, RN, Arland HERO)  Fetal Monitoring Assessment Verbalizes understanding                                               (12/01/15 GLENWOOD Pounds, RN, Arland HERO)  Fever During Pregnancy --                                                                        Fundal Tone Verbalizes understanding                                               (12/16/15 - HART, RN, BURNARD RAMAN)  Infant Security --                                                                        Med Generic/Brand Name,Purpose,Action Verbalizes understanding                                               (12/16/15 GLENWOOD LOUDER,  RN, BARNIE A)  Med Preadministration Procedures --                                                                        Medication Dosage, Route, Scheduling Verbalizes understanding                                               (12/16/15 GLENWOOD LOUDER, RN, [facility] A)  Medication Instructions Verbalizes understanding                                               (12/01/15 GLENWOOD Pounds, RN, Arland HERO)  Medication Precautions Verbalizes understanding                                               (12/16/15 GLENWOOD LOUDER, RN, [facility] A)  Medication Side Effects Verbalizes understanding                                               (12/01/15 GLENWOOD Pounds, RN, Arland HERO)  Nutrition Counseling Verbalizes understanding                                                (12/01/15 GLENWOOD Pounds, RN, Arland HERO)  Pain Management Verbalizes understanding                                               (12/16/15 GLENWOOD LOUDER, RN, [facility] A)  Plan of Care Verbalizes understanding                                               (12/16/15 GLENWOOD LOUDER, RN, BARNIE A)  Postoperative Instructions Verbalizes understanding                                               (12/16/15 - HART, RN, KELLY S)  Premature Labor Signs and Symptoms --  Preoperative Instructions Verbalizes understanding                                               (12/16/15 - HART, RN, BURNARD S)  Reportable Symptoms Verbalizes understanding                                               (12/16/15 - HART, RN, BURNARD S)  Rupture of Membranes Verbalizes understanding                                               (12/01/15 GLENWOOD Pounds, RN, Arland HERO)  Safety Verbalizes understanding                                               (12/16/15 GLENWOOD LOUDER, RN, [facility] A)  Safety, [place] understanding                                               (12/16/15 GLENWOOD LOUDER, RN, [facility] A)  Seat Belts --                                                                        Sexual Activity Guideline/Restriction --                                                                        Smoking Cessation --                                                                        Surgery Verbalizes understanding                                               (12/16/15 - HART, RN, BURNARD S)  Treatments/Procedures/Tests --  Tubal Sterilization --                                                                        Ultrasound --                                                                        Unit Procedures Verbalizes understanding                                                (12/16/15 GLENWOOD LOUDER, RN, [facility] A)  Vaccinations --                                                                        Vaginal Bleeding --                                                                          Postpartum Patient Education  Activity Expectations Verbalizes understanding                                               (12/16/15 GLENWOOD LOUDER, RN, [facility] A)  Bladder/Bowel Function Verbalizes understanding                                               (12/16/15 GLENWOOD LOUDER, RN, [facility] A)  Breast Care --                                                                        Contact Health Care Provider --  Cramps Verbalizes understanding                                               (12/16/15 GLENWOOD LOUDER, RN, [facility] A)  Diagnostic Results --                                                                        Equipment/Devices --                                                                        Incision/Episiotomy Care Verbalizes understanding                                               (12/16/15 GLENWOOD LOUDER, RN, [facility] A)  Lochia Changes Verbalizes understanding                                               (12/16/15 GLENWOOD LOUDER, RN, [facility] A)  Med Generic/Brand Name,Purpose,Action Verbalizes understanding                                               (12/16/15 GLENWOOD LOUDER, RN, [facility] A)  Medication Dosage, Route, Scheduling Verbalizes understanding                                               (12/16/15 GLENWOOD LOUDER, RN, [facility] A)  Medication Precautions Verbalizes understanding                                               (12/16/15 GLENWOOD LOUDER, RN, [facility] A)  Nutrition Counseling Verbalizes understanding                                               (12/01/15 GLENWOOD Pounds, RN, Arland HERO)  Pain Management Verbalizes understanding                                               (12/16/15 -  VICCI, RN, [facility] A)  Perineal Care --                                                                         Plan of Care Verbalizes understanding                                               (12/16/15 GLENWOOD VICCI, RN, [facility] A)  Postoperative Instructions Verbalizes understanding                                               (12/16/15 - HART, RN, BURNARD S)  Postpartum Booklet Given to Patient --                                                                        Postpartum Depression --                                                                        Preoperative Instructions Verbalizes understanding                                               (12/16/15 - HART, RN, BURNARD RAMAN)  Preparing Formula Verbalizes understanding                                               (12/16/15 GLENWOOD VICCI, RN, [facility] A)  Room Orientation Verbalizes understanding                                               (12/16/15 GLENWOOD VICCI, RN, [facility] A)  Safety Verbalizes understanding                                               (12/16/15 GLENWOOD VICCI, RN, DEBORAH A)  Safety, [place] understanding                                               (  12/16/15 GLENWOOD LOUDER, RN, DEBORAH A)  Sibling/Family Adjustment --                                                                        Carlyn Mealing --                                                                        Surgery Verbalizes understanding                                               (12/16/15 - HART, RN, BURNARD RAMAN)  Treatments/Procedures/Tests --                                                                        Unit Procedures Verbalizes understanding                                               (12/16/15 GLENWOOD LOUDER, RN, [facility] A)  Vaginal Discharge --                                                                          --------------------------------------------------------------------------------------------------------------------------------------------   [name]  DOB: 1991-02-27  Registration  and Pregnancy Information   Race:  White Ethnicity:  Not Hispanic or Latino   Marital Status:  Married   Language(s):  English   Religious Preference(s):  Private/Not Given   Occupation/Education:  See social history above if documented.    Address, Phone, and Health Plans   Home Address:  [address] [place], [place] [address]   Phone:  [phone] (Home), --, [phone] (Work)     Health Plans:    1 - BCBS  of [facility]    Member/Group:  [id number]    Deductible: $  -- Type:  [employer]    Address:  [address], [place], [place] [address]    Phone:  [phone]   2 - BCBS  of [facility]    Member/Group:  [id number]    Deductible: $  -- Type:  [facility]    Address:  [address], [place], [place] [address]    Phone:  [phone]   3 -  BCBS  of [facility]    Member/Group:  [id number]    Deductible: $  -- Type:  [facility]    Address:  [address], [place], [place] [address]    Phone:  [phone]   4 - BCBS  of [facility]    Member/Group:  [id number]    Deductible: $  -- Type:  [facility]    Address:  [address], [place], [place] [address]    Phone:  [phone]    General Prenatal Information   OB Provider(s) Information:  Not explicitly recorded. May be noted in prenatal encounter section below.   **See below for detailed information for all visits and information.     Delivery Center/Hospital Information:  --   Newborn Provider Information:  BIAGIO IHA   Referring Provider Information:  TRIXIE JANANN ORN   Primary Provider Information:  TRIXIE JANANN ORN   Husband/Partner Information:  --   --   Support Person Information:  Arley Hurst  (Husband)   Planned/Unplanned Pregnancy:  --   Date Consent Signed for Tubal Ligation:  --   Date Prenatal Record Sent to Hospital:  --    --------------------------------------------------------------------------------------------------------------------------------------------   HURST BOUCHARD West  DOB: 06/16/1991  Prenatal Visits and Encounters  (Known  encounters since 03/25/2015. May include lab encounters.)  Date  Location Provider Type Medical Service  12/16/2015 01 TRIXIE JANANN ORN IPD-Inpatient MP MAT-Maternity  12/01/2015 01 TRIXIE JANANN ORN OBD-Observation MP MAT-Maternity  12/01/2015 01 TAYLOR-MD,  ROBERT A OBD-Observation MP MAT-Maternity  12/01/2015 01 LYNCH-MD,  MEGHAN R OBD-Observation MP MAT-Maternity  11/18/2015 [facility]Laboratory  07/08/2015 Echocardiogram CICCONE-MD,  NORLEEN SHARPER OPD-Outpatient MP MED-Medicine  06/01/2015 RH PP BULLEN-MD,  LAURI W [facility] LAB-Laboratory    --------------------------------------------------------------------------------------------------------------------------------------------   HURST Heli West  DOB: 1991/04/27  Visit / Encounter Location Information  **The following information represents known location and contact information for locations visited during pregnancy   [facility]    Business Address:          [address] [place], [place]  [address]    [phone]) [phone] (Business)     [facility]   Address:    No addresses found on file for location   Phone:    No phone numbers found on file for location     [facility] Address:          [address]          [place], [place]  [address]   Phone:    No phone numbers found on file for location    --------------------------------------------------------------------------------------------------------------------------------------------   JOELEEN, WORTLEY West  DOB: 1990-08-20  Problems   (Active Problems Only)  Body mass index 30+ - obesity   (SNOMED CT: 746151988, Onset: 12/16/15)   Comment:  Problem added by Discern Expert  (SYSTEM,  SYSTEM on 12/16/15)   Uterine scar from previous surgery in pregnancy, childbirth and the puerperium (Related to Pregnant problem)  (SNOMED CT: 693571988, Onset: 12/01/15)   Comment:  Problem added by Discern Expert   (SYSTEM,  SYSTEM on 12/01/15)   Group B streptococcus (Related to Pregnant problem)  (SNOMED CT: 506381983, Onset: 12/01/15)   Comment:  Problem added by Discern Expert  (SYSTEM,  SYSTEM on 12/01/15)   Pregnant   (SNOMED CT: 808926986, Onset: 03/25/15)  Hypothyroid   (SNOMED  CT: 507160980, Onset: --)  Cluster headache   (SNOMED CT: 702661985, Onset: --)  At risk for infection   (SNOMED CT: 786229980, Onset: --)   Comment:  Problem added automatically by system based on initiation of Risk For Infection Plan of Care                       (SYSTEM,  SYSTEM on 12/16/15)     Delivery Summary  A   Membrane Status Information    ROM Date/Time:  12/16/15 08:13:00    ROM Type:      Amniotic Fluid Color/Description:  Clear    Premature Rupture of Membranes:  No    Prolonged Rupture of Membranes:  No   Labor Information    Labor Onset Date/Time:      Length of Labor 1st Stage Hrs Calc:      Length of Labor 1st Stage:      2nd Stage Onset Date/Time:      Length of Labor 2nd Stage Hrs Calc:      Length of Labor 2nd Stage:      3rd Stage Onset Date/Time:      Length of Labor 3rd Stage:      Labor Onset Methods:      Induction Methods:      Augmentation Methods:      Precipitous Labor:  No    Prolonged Labor:  No   Fetal Monitoring    FHR Monitoring Method:  Doppler ultrasound   Delivery Information    Delivery Type:  C-Section, vacuum assist    Delivery of Head Date/Time:  12/16/15 08:14:00    Date/Time of Birth:  12/16/15 08:14:00    Placenta Delivery Date/Time:  12/16/15 08:16:00    Placenta Delivery Method:  Manual extraction    Placenta to Pathology:      Reason for C-Section:  Elective repeat C-section    Cord Blood Banking:  No    Cord Blood Sent to Lab:  Yes    Forceps Type:      Vacuum Number of Attempts:  1    Vacuum Number of Cap Detachments:  0    Vacuum Total Pressure:      Vacuum Total Time of Application:  1    Vacuum Type:  Manual pump by provider    Anesthetist:  CAMPBELL-MD,  STEPHEN    Maternal Delivery  Complications:  None    Shoulder Dystocia Nursing Interventions:      Shoulder Dystocia Provider Interventions:      Shoulder Dystocia Fetal Arm Impacted:      Assistant Physician(s):  HAJZUS-PA,  VANESSA ANN   Neonatal Information    Risk Factors:  None    Neonate Complications:  Size, large for gestational age    Nuchal Cord Times:      Nuchal Cord Tension:      Nuchal Cord Intervention:      Umbilical Cord Description:  3 vessel cord   Infant Data    Gender:  Female    ID Band Number:  [phone]    Neonate Outcome:  Live birth    Security Tag Number:  [id number] (Modified)    Birth Weight:  4.020 kg    Apgar Score 1 Minute:  9    Apgar Score 5 Minute:  9    Apgar Score 10 Minute:      Pediatrician:  CARISTI-MD,  SUZEN         --------------------------------------------------------------------------------------------------------------------------------------------  Note: Items  documented with '--' had no clinical data which qualified at time of report creation     ***** END OF REPORT *****

## 2015-12-17 NOTE — Nursing Note (Signed)
Medication Administration Follow Up-Text       Medication Administration Follow Up Entered On:  12/17/2015 0:40 EDT    Performed On:  12/17/2015 1:30 EDT by Laural BenesJOHNSON, RN, [facility]      Intervention Information:     diphenhydrAMINE  Performed by Laural BenesJOHNSON, RN, [facility] on 12/17/2015 00:30:00 EDT       diphenhydrAMINE,25mg   Oral,itching/allergic reaction       Medication Effectiveness Evaluation   Medication Administration Reason :   Other: itching   Laural BenesJOHNSON, RN, [facility] - 12/17/2015 0:39 EDT

## 2015-12-17 NOTE — Nursing Note (Signed)
Medication Administration Follow Up-Text       Medication Administration Follow Up Entered On:  12/17/2015 0:28 EDT    Performed On:  12/16/2015 18:17 EDT by Laural BenesJOHNSON, RN, [facility]      Intervention Information:     diphenhydrAMINE  Performed by Sharon SellerLLOYD, RN, KAREN A on 12/16/2015 17:17:00 EDT       diphenhydrAMINE,25mg   Oral,itching/allergic reaction       Medication Effectiveness Evaluation   Medication Administration Reason :   Other: itching   Medication Response :   Symptoms improved   Laural BenesJOHNSON, RN, [facility] - 12/17/2015 0:28 EDT

## 2015-12-18 NOTE — Discharge Summary (Signed)
 [facility] Patient Summary               [facility]  [address] [place], [place] [address]  [phone]  Patient Discharge Instructions     Name: [name], [name] [facility]  Current Date: 12/18/2015 09:18:05  DOB: 04-04-1991 MRN: [id number] FIN:< %ALIASEFIN NBR%>  Patient Address: [address] [place] [address]  Patient Phone: [phone]  Primary Care Provider:  Name: [name]  Phone: [phone]   Immunizations Provided:      Discharge Diagnosis: [redacted] weeks gestation of pregnancy; Previous cesarean delivery affecting pregnancy, antepartum  Discharged To:                               < %DTADISCHARGED TO%>  Home Treatments:                           TREATMENTS%>  Devices/Equipment:                      < %DTAHOME EQUIPMENT%>  Post Hospital Services: SERVICES%>  Professional Skilled Services:       SERVICES%>  Therapist, sports and Community Resources:                < %DTASPECIAL SERVICES AND COMMUNITY RESOURCES%>  Mode of Discharge Transportation:                              < %DTADISCHARGE TRANSPORTATION%>  Discharge Orders:         Discharge Patient 12/18/15 7:33:00 EDT         Comment:      Medications  During the course of your visit, your medication list was updated with the most current information. The details of those changes are reflected below:         New Medications  Printed Prescriptions  acetaminophen-oxyCODONE (oxyCODONE-acetaminophen 5 mg-325 mg oral tablet) 1-2 Tabs Oral (given by mouth) every 4 hours as needed for moderate pain. Refills: 0., MAX DAILY DOSE OF ACETAMINOPHEN = 3000 MG  Last Dose:____________________  ibuprofen (ibuprofen 800 mg oral tablet) 1 Tabs Oral (given by mouth) every 8 hours as needed For Pain. Refills: 0.  Last Dose:____________________  Other Medications  docusate (Colace 100 mg oral capsule) 1 Capsules Oral (given by mouth) 2 times a day.  Last Dose:____________________  Medications that have not changed  Other Medications  levothyroxine (Levoxyl 50 mcg  (0.05 mg) oral tablet) Oral (given by mouth) every day.  Last Dose:____________________  multivitamin, prenatal (Classic Prenatal oral tablet) Oral (given by mouth) every day.  Last Dose:____________________  No Longer Take the Following Medications  APAP/dextromethorphan/phenylephrine (Tylenol Cold Multi-Symptom Daytime) Oral (given by mouth) every 4 hours as needed for allergy symptoms.  Stop Taking Reason: Physician Request         [facility] would like to thank you for allowing us  to assist you with your healthcare needs. The following includes patient education materials and information regarding your injury/illness.     [name] has been given the following list of follow-up instructions, prescriptions, and patient education materials:  Follow-up Instructions             With: Address: When:   [name] [address]  [place]  [address]  [phone]    Comments:   Call for follow-up appointment in 2 weeks.                       It is important to always keep an active list of medications available so that you can share with other providers and manage your medications appropriately. As an additional courtesy, we are also providing you with your final active medications list that you can keep with you.           acetaminophen-oxyCODONE (oxyCODONE-acetaminophen 5 mg-325 mg oral tablet) 1-2 Tabs Oral (given by mouth) every 4 hours as needed for moderate pain. Refills: 0., MAX DAILY DOSE OF ACETAMINOPHEN = 3000 MG  docusate (Colace 100 mg oral capsule) 1 Capsules Oral (given by mouth) 2 times a day.  ibuprofen (ibuprofen 800 mg oral tablet) 1 Tabs Oral (given by mouth) every 8 hours as needed For Pain. Refills: 0.  levothyroxine (Levoxyl 50 mcg (0.05 mg) oral tablet) Oral (given by mouth) every day.  multivitamin, prenatal (Classic Prenatal oral tablet) Oral (given by mouth) every day.      Take only the medications listed above. Contact your doctor prior to  taking any medications not on this list.  Discharge instructions, if any, will display below     Instructions for Diet: for Diet%>  Instructions for Supplements:< %DTAProvider Supplement Instructions%>  Instructions for Activity: for Activity%>  Instructions for Wound Care:< %DTAProvider Instructions for Wound Care%     Medication leaflets, if any, will display below         Patient education materials, if any, will display below               DISCHARGE INSTRUCTIONS FOR C-SECTION DELIVERY   Diet: Eat a well balanced diet. Increase fluids and fiber to help prevent constipation.    Activity:   o No heavy lifting for 2 weeks, increase activity as tolerated.   o Do not drive while taking narcotic pain medication or until you are able to twist your body quickly to look over your shoulder and step on the breaks (this usually takes about 2 weeks).   Wound Care:  o Keep incision clean and dry.   o You may shower as needed. Gently wash your incision with soap and water; pat dry with a clean towel.  o  Nothing in the vagina for 6 weeks (no sex, no tampons, no douching).   Breast Care:   o Please refer to the postpartum book for questions about breastfeeding or call the lactation consultant as needed.  o [facility]: [phone].  [facility]: [phone]   Call your doctor for:  o Temperature greater than 100.4  o Severe pain in your abdomen.   o Vaginal bleeding that requires you to change your pad more than once every hour.  o Pain or urgency with urination.  o Pain, redness, or swelling in either leg.  o Pain, redness, or tenderness in either breast that may be accompanied with flu-like symptoms.  o Any signs of postpartum depression or changes in your mood that are concerning.   Follow up appointment:  o Call your physician to schedule a 2 week follow-up appointment.                    IS IT A STROKE?  Act FAST and Check for these signs:     FACE  Does the face  look uneven?     ARM                    Does one arm drift down?     SPEECH             Does their speech sound strange?     TIME                   Call 9-1-1 at any sign of stroke  Heart Attack Signs  Chest discomfort: Most heart attacks involve discomfort in the center of the chest and lasts more than a few minutes, or goes away and comes back. It can feel like uncomfortable pressure, squeezing, fullness or pain.  Discomfort in upper body: Symptoms can include pain or discomfort in one or both arms, back, neck, jaw or stomach.  Shortness of breath: With or without discomfort.  Other signs: Breaking out in a cold sweat, nausea, or lightheaded.  Remember, MINUTES DO MATTER. If you experience any of these heart attack warning signs, call 9-1-1 to get immediate medical attention!             Yes - Patient/Family/Caregiver demonstrates understanding of instructions given  ______________________________ ___________ ___________________ ___________  Patient/Family/ Caregiver Signature Date/Time          Provider Signature Date/Time

## 2015-12-18 NOTE — Nursing Note (Signed)
Medication Administration Follow Up-Text       Medication Administration Follow Up Entered On:  12/18/2015 1:58 EDT    Performed On:  12/17/2015 23:55 EDT by Rulon Eisenmenger, RN, Dannielle S      Intervention Information:     ibuprofen  Performed by Rulon Eisenmenger, RN, Dannielle S on 12/17/2015 22:55:00 EDT       ibuprofen,800mg   Oral,other (see comment)       Medication Effectiveness Evaluation   Medication Administration Reason :   Pain   Medication Effective :   Yes   Medication Response :   Symptoms improved   Nicholes Stairs - 12/18/2015 1:58 EDT   Pain Assessment   Numeric Rating Pain Scale :   0 = No pain   Nicholes Stairs - 12/18/2015 1:58 EDT   Image 4 -  Images currently included in the form version of this document have not been included in the text rendition version of the form.

## 2015-12-18 NOTE — Discharge Summary (Signed)
 [facility] Clinical Summary             [facility]  Post-Acute Care Transfer Instructions  PERSON INFORMATION   Name: MCKENZYE, CUTRIGHT   MRN: [id number]    FIN#: [id number]   PHYSICIANS  Admitting Physician: TRIXIE JANANN ORN  Attending Physician: TRIXIE JANANN ORN   PCP: TRIXIE JANANN ORN  Discharge Diagnosis:  [redacted] weeks gestation of pregnancy; Previous cesarean delivery affecting pregnancy, antepartum  Comment:       PATIENT EDUCATION INFORMATION  Instructions:             < %DISINSTRUCT%>  Medication Leaflets:             < %MEDLEAFLETNAMES%>  Follow-up:             < %FOLLOWUPTABLE%>             < %SCHEDULE%>     MEDICATION LIST  Medication Reconciliation at Discharge:         New Medications  Printed Prescriptions  acetaminophen-oxyCODONE (oxyCODONE-acetaminophen 5 mg-325 mg oral tablet) 1-2 Tabs Oral (given by mouth) every 4 hours as needed for moderate pain. Refills: 0., MAX DAILY DOSE OF ACETAMINOPHEN = 3000 MG  Last Dose:____________________  ibuprofen (ibuprofen 800 mg oral tablet) 1 Tabs Oral (given by mouth) every 8 hours as needed For Pain. Refills: 0.  Last Dose:____________________  Other Medications  docusate (Colace 100 mg oral capsule) 1 Capsules Oral (given by mouth) 2 times a day.  Last Dose:____________________  Medications that have not changed  Other Medications  levothyroxine (Levoxyl 50 mcg (0.05 mg) oral tablet) Oral (given by mouth) every day.  Last Dose:____________________  multivitamin, prenatal (Classic Prenatal oral tablet) Oral (given by mouth) every day.  Last Dose:____________________  No Longer Take the Following Medications  APAP/dextromethorphan/phenylephrine (Tylenol Cold Multi-Symptom Daytime) Oral (given by mouth) every 4 hours as needed for allergy symptoms.  Stop Taking Reason: Physician Request         Patient's Final Home Medication List Upon Discharge:          acetaminophen-oxyCODONE (oxyCODONE-acetaminophen 5 mg-325 mg oral tablet) 1-2 Tabs Oral (given by  mouth) every 4 hours as needed for moderate pain. Refills: 0., MAX DAILY DOSE OF ACETAMINOPHEN = 3000 MG  docusate (Colace 100 mg oral capsule) 1 Capsules Oral (given by mouth) 2 times a day.  ibuprofen (ibuprofen 800 mg oral tablet) 1 Tabs Oral (given by mouth) every 8 hours as needed For Pain. Refills: 0.  levothyroxine (Levoxyl 50 mcg (0.05 mg) oral tablet) Oral (given by mouth) every day.  multivitamin, prenatal (Classic Prenatal oral tablet) Oral (given by mouth) every day.         Comment:       ORDERS         Order Name Order Details   Discharge Patient 12/18/15 7:33:00 EDT

## 2015-12-18 NOTE — Nursing Note (Signed)
Medication Administration Follow Up-Text       Medication Administration Follow Up Entered On:  12/18/2015 9:03 EDT    Performed On:  12/18/2015 8:22 EDT by Sharon Seller, RN, KAREN A      Intervention Information:     ibuprofen  Performed by Sharon Seller, RN, KAREN A on 12/18/2015 07:22:00 EDT       ibuprofen,800mg   Oral,other (see comment)       Medication Effectiveness Evaluation   Medication Administration Reason :   Pain   Medication Effective :   Yes   Medication Response :   Symptoms improved, Continue to observe for symptoms   LLOYD, RN, KAREN A - 12/18/2015 9:03 EDT

## 2015-12-18 NOTE — Discharge Summary (Signed)
Discharge Summary                 [place]  Gabriella Espinal Ramon DredgeEllen Emyah Roznowski, MD  Admission Date: 12/16/2015  Discharged Date:     PRINCIPAL DISCHARGE DIAGNOSES:  Term pregnancy.    OPERATIVE PROCEDURES:  Repeat low transverse cesarean section.    HISTORY AND PHYSICAL:  [name] is a 25 year old G2 P1 who presents at 39  weeks for scheduled repeat C-section.  Her pregnancy was complicated  by hypothyroidism, although she required no dose adjustments.  She  did have gestational diabetes last pregnancy that was easily diet  controlled but had a normal Glucola this time.    PAST MEDICAL HISTORY:  Significant for complicated migraines but had  none this pregnancy.    HOSPITAL COURSE:  On the day of admission [name] underwent a repeat low  transverse C-section performed by Dr. Doristine LocksLauri Delois Tolbert with Willodean RosenthalVanessa  Hajzus, PA as assist.  She delivered a viable female at 8:14 weighing 8  pounds 14 ounces with Apgars 9 at 1 minute and 9 at 5 minutes.   Estimated blood loss at the time of surgery was 600 mL.  [name] has  done great in the postoperative period.  She has had minimal pain  complaints.  She initially had really no lochia, but developed some  cramping yesterday evening and some gas pain which resolved once she  actually started to have a little bit of vaginal bleeding and also  passing flatus.  She is not nauseated.  She is bottle feeding and is  wearing a sports bra and having no breast symptoms.  Her vital signs  have remained stable.  Her postop hemoglobin was 9.9.  Her incision is  clean, dry and intact.  Today on postoperative day 2, she requests  discharge.  She will be discharged home to follow up in the office in  2 weeks for an incision check which I believe is already scheduled.      Beryle FlockLauri Ellen  Lacretia Tindall, MD  TR: *n DD: 12/18/2015 07:38 TD: 12/18/2015 10:18 Job#: [id number]  \\[id number]#: [id number]  \\[id number]    Signature Line     Electronically Signed on 12/21/2015 07:55 AM EDT   ________________________________________________    [name],  [name]               Modified by: [name],  Earla Charlie W on 12/21/2015 07:55 AM EDT

## 2016-10-11 ENCOUNTER — Encounter: Payer: Self-pay | Admitting: Osteopathic Medicine

## 2016-10-11 ENCOUNTER — Ambulatory Visit (INDEPENDENT_AMBULATORY_CARE_PROVIDER_SITE_OTHER): Payer: 59 | Admitting: Osteopathic Medicine

## 2016-10-11 VITALS — BP 125/90 | HR 93 | Ht 64.0 in | Wt 214.0 lb

## 2016-10-11 DIAGNOSIS — R6882 Decreased libido: Secondary | ICD-10-CM | POA: Diagnosis not present

## 2016-10-11 DIAGNOSIS — Z3009 Encounter for other general counseling and advice on contraception: Secondary | ICD-10-CM

## 2016-10-11 DIAGNOSIS — E039 Hypothyroidism, unspecified: Secondary | ICD-10-CM

## 2016-10-11 DIAGNOSIS — R5383 Other fatigue: Secondary | ICD-10-CM

## 2016-10-11 NOTE — Patient Instructions (Signed)
Things to think about:  Birth control: IUD's vs other Hormonal IUD: Mirena, Skyla, Rutha BouchardKyleena, [place]  Non-Hormonal IUD (copper): Paragard  Under the skin implant: Nexplanon Injections: Depo-Provera Pills or ring  Libido medication: prescription Addyi  Let me know what you decide! Let's get some labs and make sure nothing else is going on. Otherwise, let me know what you decide on birth control and we will get you set up! Feel free to call with any other questions/concerns. We will let you know about lab results in 1-2 days .

## 2016-10-11 NOTE — Progress Notes (Signed)
HPI: Veronica Gay is a 26 y.o. female  who [name], [facility],  for chief complaint of:  Chief Complaint  Patient presents with  . Establish Care    Birth control options, sluggish/fatigue, nausea and cramping since discontinuation of birth control    Contraception: Currently on NuvaRing, has noticed some mood changes and decreased libido since being on this medication. Previously on Mirena prior to getting pregnant and was happy with this device, after first child had Mirena reinserted but had significant cramping and libido problems, not sure whether due to postpartum issues or due to the IUD. After second child got back on NuvaRing. Would like to discuss options today. Has been off of the NuvaRing for a few weeks due to misplacing medication when moving, has noticed abdominal cramping and some nausea since then. Abnormal vaginal bleeding/discharge. Has been using condoms as backup contraception  Hypothyroid: Fairly recent diagnosis after second delivery. Patient is on low-dose levothyroxine, has been about 9 months since blood levels were checked.  Low libido: Has been a significant problem since having children, patient has questions about any possible medication options, would like to think about this.  Past medical, surgical, social and family history reviewed: Patient Active Problem List   Diagnosis Date Noted  . VITAMIN D DEFICIENCY 04/23/2010  . ANEMIA, IRON DEFICIENCY 04/23/2010  . DIZZINESS 02/25/2010  . PELVIC  PAIN 05/14/2009  . FATIGUE 12/01/2008   No past surgical history on file. Social History  Substance Use Topics  . Smoking status: Never Smoker  . Smokeless tobacco: Never Used  . Alcohol use No   Family History  Problem Relation Age of Onset  . Lung cancer      grandfather  . Depression      mom,sister  . Diabetes      grandmother,grandfather  . Hypertension      father  . Thyroid disease      father      Current medication list and allergy/intolerance information reviewed:   No current outpatient prescriptions on file.   No current facility-administered medications for this visit.    Allergies  Allergen Reactions  . Cephalosporins       Review of Systems:  Constitutional:  No  fever, no chills, No recent illness, No unintentional weight changes. +significant fatigue.   HEENT: No  headache, no vision change, no hearing change, No sore throat, No  sinus pressure  Cardiac: No  chest pain, No  pressure, No palpitations, No  Orthopnea  Respiratory:  No  shortness of breath. No  Cough  Gastrointestinal: No  abdominal pain, No  nausea, No  vomiting,  No  blood in stool, No  diarrhea, No  constipation   Musculoskeletal: No new myalgia/arthralgia  Genitourinary: No  incontinence, No  abnormal genital bleeding, No abnormal genital discharge  Skin: No  Rash, No other wounds/concerning lesions  Hem/Onc: No  easy bruising/bleeding, No  abnormal lymph node  Endocrine: No cold intolerance,  No heat intolerance. No polyuria/polydipsia/polyphagia   Neurologic: No  weakness, No  dizziness, No  slurred speech/focal weakness/facial droop  Psychiatric: No  concerns with depression, No  concerns with anxiety, No sleep problems, No mood problems  Exam:  BP 125/90   Pulse 93   Ht 5\' 4"  (1.626 m)   Wt 214 lb (97.1 kg)   BMI 36.73 kg/m   Constitutional: VS see above. General Appearance: alert, well-developed, well-nourished, NAD  Eyes: Normal lids and conjunctive,  non-icteric sclera  Ears, Nose, Mouth, Throat: MMM, Normal external inspection ears/nares/mouth/lips/gums  Neck: No masses, trachea midline. No thyroid enlargement.   Respiratory: Normal respiratory effort. no wheeze, no rhonchi, no rales  Cardiovascular: S1/S2 normal, no murmur, no rub/gallop auscultated. RRR. No lower extremity edema  Gastrointestinal: Nontender, no masses.  Musculoskeletal: Gait normal. No  clubbing/cyanosis of digits.   Neurological: Normal balance/coordination. No tremor.   Skin: warm, dry, intact. No rash/ulcer.     Psychiatric: Normal judgment/insight. Normal mood and affect. Oriented x3.    ASSESSMENT/PLAN:   Encounter for counseling regarding contraception - Discussed options regarding continuation of ring, options for IUD, arm insert, Depo-Provera injections. Patient would like to avoid pill, not ready for surgical  Fatigue, unspecified type - Plan: CBC with Differential/Platelet, COMPLETE METABOLIC PANEL WITH GFR, TSH, VITAMIN D 25 Hydroxy (Vit-D Deficiency, Fractures)  Hypothyroidism, unspecified type - Plan: TSH  Low libido    Patient Instructions  Things to think about:  Birth control: IUD's vs other Hormonal IUD: Mirena, Skyla, Kyleena, [place]  Non-Hormonal IUD (copper): Paragard  Under the skin implant: Nexplanon Injections: Depo-Provera Pills or ring  Libido medication: prescription Addyi  Let me know what you decide! Let's get some labs and make sure nothing else is going on. Otherwise, let me know what you decide on birth control and we will get you set up! Feel free to call with any other questions/concerns. We will let you know about lab results in 1-2 days .       Visit summary with medication list and pertinent instructions was printed for patient to review. All questions at time of visit were answered - patient instructed to contact office with any additional concerns. ER/RTC precautions were reviewed with the patient. Follow-up plan: Return for birth control when you've decided what you'd like to use, as needed and based on labs .

## 2016-10-12 LAB — CBC WITH DIFFERENTIAL/PLATELET
Basophils Absolute: 0 cells/uL (ref 0–200)
Basophils Relative: 0 %
Eosinophils Absolute: 309 cells/uL (ref 15–500)
Eosinophils Relative: 3 %
HEMATOCRIT: 39.9 % (ref 35.0–45.0)
Hemoglobin: 13.2 g/dL (ref 11.7–15.5)
Lymphocytes Relative: 34 %
Lymphs Abs: 3502 cells/uL (ref 850–3900)
MCH: 27.4 pg (ref 27.0–33.0)
MCHC: 33.1 g/dL (ref 32.0–36.0)
MCV: 83 fL (ref 80.0–100.0)
MPV: 9.7 fL (ref 7.5–12.5)
Monocytes Absolute: 824 cells/uL (ref 200–950)
Monocytes Relative: 8 %
Neutro Abs: 5665 cells/uL (ref 1500–7800)
Neutrophils Relative %: 55 %
PLATELETS: 299 10*3/uL (ref 140–400)
RBC: 4.81 MIL/uL (ref 3.80–5.10)
RDW: 14.4 % (ref 11.0–15.0)
WBC: 10.3 10*3/uL (ref 3.8–10.8)

## 2016-10-12 LAB — COMPLETE METABOLIC PANEL WITH GFR
ALT: 12 U/L (ref 6–29)
AST: 10 U/L (ref 10–30)
Albumin: 4.5 g/dL (ref 3.6–5.1)
Alkaline Phosphatase: 36 U/L (ref 33–115)
BUN: 16 mg/dL (ref 7–25)
CO2: 24 mmol/L (ref 20–31)
Calcium: 9.4 mg/dL (ref 8.6–10.2)
Chloride: 102 mmol/L (ref 98–110)
Creat: 0.6 mg/dL (ref 0.50–1.10)
GFR, Est African American: >89 mL/min (ref >=60)
GFR, Est Non African American: >89 mL/min (ref >=60)
Glucose, Bld: 71 mg/dL (ref 65–99)
Potassium: 3.8 mmol/L (ref 3.5–5.3)
SODIUM: 138 mmol/L (ref 135–146)
Total Bilirubin: 0.4 mg/dL (ref 0.2–1.2)
Total Protein: 7.1 g/dL (ref 6.1–8.1)

## 2016-10-12 LAB — TSH: TSH: 1.81 mIU/L

## 2016-10-12 LAB — VITAMIN D 25 HYDROXY (VIT D DEFICIENCY, FRACTURES): Vit D, 25-Hydroxy: 24 ng/mL — ABNORMAL LOW (ref 30–100)

## 2017-01-09 ENCOUNTER — Ambulatory Visit (INDEPENDENT_AMBULATORY_CARE_PROVIDER_SITE_OTHER): Payer: 59 | Admitting: Osteopathic Medicine

## 2017-01-09 ENCOUNTER — Encounter: Payer: Self-pay | Admitting: Osteopathic Medicine

## 2017-01-09 VITALS — BP 114/77 | HR 89 | Wt 194.0 lb

## 2017-01-09 DIAGNOSIS — T63481A Toxic effect of venom of other arthropod, accidental (unintentional), initial encounter: Secondary | ICD-10-CM | POA: Diagnosis not present

## 2017-01-09 MED ORDER — METHYLPREDNISOLONE SODIUM SUCC 125 MG IJ SOLR
125.0000 mg | Freq: Once | INTRAMUSCULAR | Status: AC
  Administered 2017-01-09: 125 mg via INTRAMUSCULAR

## 2017-01-09 NOTE — Patient Instructions (Signed)
Plan:  Steroid shot in office today  Continue cold compresses and benadryl at home   If worse, or if pain/fever develops (as opposed to just swelling/itching), if vision changes or eye pain, please come see us ASAP!

## 2017-01-09 NOTE — Addendum Note (Signed)
Addended by: Prarthana Parlin L on: 01/09/2017 04:58 PM   Modules accepted: Orders  

## 2017-01-09 NOTE — Progress Notes (Signed)
HPI: Veronica Gay is a 26 y.o. female  who [facility] today, 01/09/17,  for chief complaint of:  Chief Complaint  Patient presents with  . Insect Bite    R eye    . Insect sting: stung by black wasp (she thinks) few days ago and subsequent swelling R upper eyelid . Location: R upper eyelid . Quality: swollen, itchy . Duration: 2 days ago (Sat PM) sting, yesterday noted swelling . Modifying factors: tried benadryl . Assoc signs/symptoms: no vision changes     Past medical history, surgical history, social history and family history reviewed.  Patient Active Problem List   Diagnosis Date Noted  . VITAMIN D DEFICIENCY 04/23/2010  . ANEMIA, IRON DEFICIENCY 04/23/2010  . DIZZINESS 02/25/2010  . PELVIC  PAIN 05/14/2009  . FATIGUE 12/01/2008    Current medication list and allergy/intolerance information reviewed.   No current outpatient prescriptions on file prior to visit.   No current facility-administered medications on file prior to visit.    Allergies  Allergen Reactions  . Cephalosporins       Review of Systems:  Constitutional: No recent illness  HEENT: No  headache, no vision change  Respiratory:  No  shortness of breath.   Skin: swelling on eyelid as per HPI   Exam:  BP 114/77   Pulse 89   Wt 194 lb (88 kg)   BMI 33.30 kg/m   Constitutional: VS see above. General Appearance: alert, well-developed, well-nourished, NAD  Eyes: Normal conjunctive, non-icteric sclera, EOMI, PERRLA.   Ears, Nose, Mouth, Throat: MMM, Normal external inspection ears/nares/mouth/lips/gums.  Neck: No masses, trachea midline.   Respiratory: Normal respiratory effort.  Musculoskeletal: Gait normal.   Neurological: Normal balance/coordination. No tremor.  Skin: warm, dry, intact. R upper eyelid mild edema and erythema, no blotching, no urticaria, no ulceration/drainage      ASSESSMENT/PLAN:   Allergic reaction to insect  sting, accidental or unintentional, initial encounter    Patient Instructions  Plan:  Steroid shot in office today  Continue cold compresses and benadryl at home   If worse, or if pain/fever develops (as opposed to just swelling/itching), if vision changes or eye pain, please come see us ASAP!     Follow-up plan: Return if symptoms worsen or fail to improve.  Visit summary with medication list and pertinent instructions was printed for patient to review, alert us if any changes needed. All questions at time of visit were answered - patient instructed to contact office with any additional concerns. ER/RTC precautions were reviewed with the patient and understanding verbalized.

## 2017-05-02 ENCOUNTER — Telehealth: Payer: Self-pay | Admitting: Osteopathic Medicine

## 2017-05-02 NOTE — Telephone Encounter (Signed)
Patient called adv that she is going to stay on her Nuva Ring. She has changed insurance companies which require her scripts to be mail order so she will have mail order pharmacy send a request for a refill for her Nuva Ring. Thanks

## 2017-05-02 NOTE — Telephone Encounter (Signed)
Ok will await this from pharmacy.

## 2017-05-10 MED ORDER — ETONOGESTREL-ETHINYL ESTRADIOL 0.12-0.015 MG/24HR VA RING: VAGINAL_RING | VAGINAL | 3 refills | Status: DC

## 2017-05-10 NOTE — Addendum Note (Signed)
Addended by: Deirdre PippinsALEXANDER, Lanell Dubie M on: 05/10/2017 01:05 PM   Modules accepted: Orders

## 2017-05-10 NOTE — Telephone Encounter (Signed)
[facility] is requesting a refill on the patient's nuva ring. They say they have faxed over several requests.They asked for a verbal but I cannot since there is no previous prescription on file for nuva ring for this patient.

## 2017-05-10 NOTE — Telephone Encounter (Signed)
I went ahead and sent a prescription, e-Rx, let me know if any other issues.

## 2017-07-28 ENCOUNTER — Telehealth: Payer: Self-pay | Admitting: Family Medicine

## 2017-07-28 DIAGNOSIS — R7989 Other specified abnormal findings of blood chemistry: Secondary | ICD-10-CM

## 2017-07-28 NOTE — Telephone Encounter (Signed)
I don't se these records. When was the last time had blood test to check levels. Can we call her pharmacy to verify dose, etc if if on generic.

## 2017-07-28 NOTE — Telephone Encounter (Signed)
Pt called clinic today requesting an Rx for her throid. Pt states she was started on medication when she was living in [place]. She advises we should have gotten those records. Pt is taking Levothyroxine 50mcg daily. [facility] [address]. Will route.

## 2017-07-31 MED ORDER — LEVOTHYROXINE SODIUM 50 MCG PO TABS: 50.0000 ug | ORAL_TABLET | Freq: Every day | ORAL | 1 refills | Status: DC

## 2017-07-31 NOTE — Telephone Encounter (Signed)
OK, new rx sent.  

## 2017-07-31 NOTE — Telephone Encounter (Signed)
I called and confirmed Levothyroxine 50 mcg (generic) once daily.

## 2017-08-01 NOTE — Telephone Encounter (Signed)
Pt advised. It has been over a year since her last labs, will place lab order to recheck TSH. Pt will go get labs done soon.

## 2017-08-01 NOTE — Addendum Note (Signed)
Addended by: Collie SiadICHARDSON, Faye Strohman M on: 08/01/2017 08:28 AM   Modules accepted: Orders

## 2017-08-11 ENCOUNTER — Ambulatory Visit (INDEPENDENT_AMBULATORY_CARE_PROVIDER_SITE_OTHER): Payer: BLUE CROSS/BLUE SHIELD | Admitting: Physician Assistant

## 2017-08-11 ENCOUNTER — Encounter: Payer: Self-pay | Admitting: Physician Assistant

## 2017-08-11 VITALS — BP 128/83 | HR 92 | Temp 98.2°F | Wt 202.0 lb

## 2017-08-11 DIAGNOSIS — R1011 Right upper quadrant pain: Secondary | ICD-10-CM

## 2017-08-11 MED ORDER — BACLOFEN 10 MG PO TABS: 10.0000 mg | ORAL_TABLET | Freq: Three times a day (TID) | ORAL | 0 refills | Status: DC | PRN

## 2017-08-11 MED ORDER — DICLOFENAC SODIUM 75 MG PO TBEC: 75.0000 mg | DELAYED_RELEASE_TABLET | Freq: Two times a day (BID) | ORAL | 0 refills | Status: DC

## 2017-08-11 NOTE — Progress Notes (Signed)
HPI:                                                                Veronica Gay is a 27 y.o. female who presents to [facility] Veronica Gay: Primary Care Sports Medicine today for RUQ abdominal pain  Patient reports sudden onset right upper abdominal pain yesterday. Pain described as sharp, severe to touch, and uncomfortable. Gradually worsening. Pain is worse with position changes. Denies known injury /trauma, but states she has been working out more rigorously lately. Denies association with meals, change in bowel habits, nausea/vomiting. She took Ibuprofen and tried heat with mild-moderate relief.   Depression screen PHQ 2/9 08/11/2017  Decreased Interest 1  Down, Depressed, Hopeless 1  PHQ - 2 Score 2  Altered sleeping 0  Tired, decreased energy 3  Change in appetite 0  Feeling bad or failure about yourself  1  Trouble concentrating 0  Moving slowly or fidgety/restless 1  Suicidal thoughts 0  PHQ-9 Score 7    GAD 7 : Generalized Anxiety Score 08/11/2017  Nervous, Anxious, on Edge 2  Control/stop worrying 2  Worry too much - different things 2  Trouble relaxing 2  Restless 2  Easily annoyed or irritable 2  Afraid - awful might happen 0  Total GAD 7 Score 12    History reviewed. No pertinent past medical history. History reviewed. No pertinent surgical history. Social History   Tobacco Use  . Smoking status: Never Smoker  . Smokeless tobacco: Never Used  Substance Use Topics  . Alcohol use: No   family history includes Depression in her unknown relative; Diabetes in her unknown relative; Hypertension in her unknown relative; Lung cancer in her unknown relative; Thyroid disease in her unknown relative.    ROS: Review of Systems  Constitutional: Negative.   Gastrointestinal: Positive for abdominal pain. Negative for blood in stool, constipation, diarrhea, melena, nausea and vomiting.  Musculoskeletal: Positive for myalgias.  Psychiatric/Behavioral: Positive for  depression.     Medications: Current Outpatient Medications  Medication Sig Dispense Refill  . baclofen (LIORESAL) 10 MG tablet Take 1 tablet (10 mg total) by mouth 3 (three) times daily as needed for muscle spasms. 30 each 0  . diclofenac (VOLTAREN) 75 MG EC tablet Take 1 tablet (75 mg total) by mouth 2 (two) times daily. 30 tablet 0  . levothyroxine (SYNTHROID, LEVOTHROID) 50 MCG tablet Take 1 tablet (50 mcg total) by mouth daily before breakfast. 90 tablet 1   No current facility-administered medications for this visit.    Allergies  Allergen Reactions  . Cephalosporins        Objective:  BP 128/83   Pulse 92   Temp 98.2 F (36.8 C) (Oral)   Wt 202 lb (91.6 kg)   LMP 08/08/2017 (Exact Date)   BMI 34.67 kg/m  Gen:  alert, not ill-appearing, no distress, appropriate for age, obese female HEENT: head normocephalic without obvious abnormality, conjunctiva and cornea clear, trachea midline Pulm: Normal work of breathing, normal phonation GI: abdomen soft, there is localized tenderness in the right lumbar region adjacent to the abdominal rectus, no rebound or guarding, negative Murphy's sign, positive Carnett's sign Neuro: alert and oriented x 3, no tremor MSK: extremities atraumatic, normal gait and station Skin: intact, no rashes  on exposed skin, no jaundice, no cyanosis     No results found for this or any previous visit (from the past 72 hour(s)). No results found.    Assessment and Plan: 27 y.o. female with   1. Abdominal wall pain in right upper quadrant - symptomatic care with NSAID, muscle relaxer, heat and gentle stretching - diclofenac (VOLTAREN) 75 MG EC tablet; Take 1 tablet (75 mg total) by mouth 2 (two) times daily.  Dispense: 30 tablet; Refill: 0 - baclofen (LIORESAL) 10 MG tablet; Take 1 tablet (10 mg total) by mouth 3 (three) times daily as needed for muscle spasms.  Dispense: 30 each; Refill: 0  Patient education and anticipatory guidance  given Patient agrees with treatment plan Follow-up as needed if symptoms worsen or fail to improve  Levonne Hubert PA-C

## 2017-08-11 NOTE — Patient Instructions (Addendum)
-   stop Ibuprofen - take Diclofenac 1 tab twice a day (anti-inflammatory) - take Baclofen 1 tab every 8 hours as needed (muscle relaxer) - gentle stretching - continue heat x 20 mins every 3 hours

## 2017-11-08 ENCOUNTER — Encounter: Payer: Self-pay | Admitting: Physician Assistant

## 2017-11-29 ENCOUNTER — Encounter: Payer: Self-pay | Admitting: Physician Assistant

## 2017-12-13 ENCOUNTER — Telehealth: Payer: Self-pay | Admitting: *Deleted

## 2017-12-13 DIAGNOSIS — E039 Hypothyroidism, unspecified: Secondary | ICD-10-CM

## 2017-12-13 MED ORDER — LEVOTHYROXINE SODIUM 50 MCG PO TABS: 50.0000 ug | ORAL_TABLET | Freq: Every day | ORAL | 0 refills | Status: DC

## 2017-12-13 NOTE — Telephone Encounter (Signed)
Called pt and lvm to inform her that her medication can only be sent for a 30 day supply to [facility] [address] Advised her that she was to come in to have labwork done in January and she has not had this done. Asked that she go to get this done ASAP. Marland KitchenHeath Gold, CMA

## 2018-02-09 ENCOUNTER — Telehealth: Payer: Self-pay | Admitting: Osteopathic Medicine

## 2018-02-09 NOTE — Telephone Encounter (Signed)
Will route to PCP as well. -EH/RMA

## 2018-02-09 NOTE — Telephone Encounter (Signed)
Ok with me 

## 2018-02-09 NOTE — Telephone Encounter (Signed)
Okay with me 

## 2018-02-09 NOTE — Telephone Encounter (Signed)
PT requested to switch to Veronica Gay for PCP.  Please advise

## 2018-02-21 ENCOUNTER — Ambulatory Visit (INDEPENDENT_AMBULATORY_CARE_PROVIDER_SITE_OTHER): Payer: 59 | Admitting: Physician Assistant

## 2018-02-21 ENCOUNTER — Encounter: Payer: Self-pay | Admitting: Physician Assistant

## 2018-02-21 VITALS — BP 120/79 | HR 89 | Ht 64.0 in | Wt 197.0 lb

## 2018-02-21 DIAGNOSIS — R5382 Chronic fatigue, unspecified: Secondary | ICD-10-CM | POA: Diagnosis not present

## 2018-02-21 DIAGNOSIS — M2550 Pain in unspecified joint: Secondary | ICD-10-CM | POA: Diagnosis not present

## 2018-02-21 DIAGNOSIS — Z7689 Persons encountering health services in other specified circumstances: Secondary | ICD-10-CM | POA: Diagnosis not present

## 2018-02-21 DIAGNOSIS — M797 Fibromyalgia: Secondary | ICD-10-CM

## 2018-02-21 DIAGNOSIS — G43109 Migraine with aura, not intractable, without status migrainosus: Secondary | ICD-10-CM | POA: Insufficient documentation

## 2018-02-21 DIAGNOSIS — Z8632 Personal history of gestational diabetes: Secondary | ICD-10-CM | POA: Insufficient documentation

## 2018-02-21 DIAGNOSIS — E039 Hypothyroidism, unspecified: Secondary | ICD-10-CM | POA: Diagnosis not present

## 2018-02-21 DIAGNOSIS — F411 Generalized anxiety disorder: Secondary | ICD-10-CM

## 2018-02-21 MED ORDER — DULOXETINE HCL 20 MG PO CPEP: ORAL_CAPSULE | ORAL | 1 refills | Status: DC

## 2018-02-21 MED ORDER — LEVOTHYROXINE SODIUM 50 MCG PO TABS: 50.0000 ug | ORAL_TABLET | Freq: Every day | ORAL | 0 refills | Status: DC

## 2018-02-21 MED ORDER — LEVOTHYROXINE SODIUM 50 MCG PO TABS: 50.0000 ug | ORAL_TABLET | Freq: Every day | ORAL | 1 refills | Status: DC

## 2018-02-21 NOTE — Progress Notes (Signed)
HPI:                                                                Veronica Gay is a 27 y.o. female who presents to [facility]: Primary Care Sports Medicine today to establish care  Current concerns: anxiety, medication refills  Hypothyroidism: developed approximately 4 years ago after pregnancy, unsure etiology. Currently takes Levothyroxine 50 mcg. Presented with hair loss, weight gain, and fatigue. She continues to endorse difficulty losing weight and chronic fatigue. Denies chest pain, heart palpitations, tremor, GI symptoms, or skin changes.  Chronic fatigue: reports feeling tired all of the time, worse at the end of the day and after working out. Associated with joint pain, worse in the wrists, left forearm and ankles. Denies constitutional symptoms, joint swelling/redness/warmth, rashes. She is exercising 5 days per week, following a low carb diet, avoiding sodas, but having difficulty losing weight. Has only had 5 pound weight loss over 2 months. Father has MGUS and fibromyalgia  Anxiety: reports feeling more easily overwhelmed with every day activities. Has struggled with anxiety for a number of years and has found it more difficult to cope recently. admits to depressed mood at times, but feels anxiety symptoms are more bothersome. Denies symptoms of mania/hypomania. Denies suicidal thinking. Denies auditory/visual hallucinations.    Depression screen [facility] 2/9 02/21/2018 08/11/2017  Decreased Interest 1 1  Down, Depressed, Hopeless 1 1  PHQ - 2 Score 2 2  Altered sleeping 1 0  Tired, decreased energy 3 3  Change in appetite 2 0  Feeling bad or failure about yourself  3 1  Trouble concentrating 1 0  Moving slowly or fidgety/restless 0 1  Suicidal thoughts 0 0  PHQ-9 Score 12 7    GAD 7 : Generalized Anxiety Score 02/21/2018 08/11/2017  Nervous, Anxious, on Edge 2 2  Control/stop worrying 2 2  Worry too much - different things 2 2  Trouble relaxing 2 2   Restless 2 2  Easily annoyed or irritable 1 2  Afraid - awful might happen 0 0  Total GAD 7 Score 11 12      Past Medical History:  Diagnosis Date  . Anxiety   . Complicated migraine    last episode 2012  . History of gestational diabetes   . Hypothyroidism    Past Surgical History:  Procedure Laterality Date  . CESAREAN SECTION    . TONSILLECTOMY     Social History   Tobacco Use  . Smoking status: Never Smoker  . Smokeless tobacco: Never Used  Substance Use Topics  . Alcohol use: No   family history includes Anxiety disorder in her mother and sister; Breast cancer in her maternal aunt and maternal grandmother; Depression in her unknown relative; Diabetes in her father, mother, paternal grandfather, and unknown relative; Fibromyalgia in her father; Hypertension in her unknown relative; Lung cancer in her maternal grandfather and unknown relative; Thyroid disease in her unknown relative.    ROS: Review of Systems  Constitutional: Positive for malaise/fatigue.  Eyes:       + vision change  Musculoskeletal: Positive for joint pain.  Neurological: Positive for dizziness and headaches.  Psychiatric/Behavioral: The patient is nervous/anxious.      Medications: Current Outpatient Medications  Medication Sig Dispense Refill  . Ergocalciferol 2000 units CAPS Take 2,000 Units by mouth daily.    Marland Kitchen etonogestrel-ethinyl estradiol (NUVARING) 0.12-0.015 MG/24HR vaginal ring Place 1 each vaginally every 28 (twenty-eight) days. Insert vaginally and leave in place for 3 consecutive weeks, then remove for 1 week.    . levothyroxine (SYNTHROID, LEVOTHROID) 50 MCG tablet Take 1 tablet (50 mcg total) by mouth daily before breakfast. 90 tablet 1  . DULoxetine (CYMBALTA) 20 MG capsule Take 1 capsule (20 mg total) by mouth at bedtime for 7 days, THEN 2 capsules (40 mg total) at bedtime for 23 days. 60 capsule 1   No current facility-administered medications for this visit.    Allergies   Allergen Reactions  . Cephalosporins        Objective:  BP 120/79   Pulse 89   Ht 5\' 4"  (1.626 m)   Wt 197 lb (89.4 kg)   LMP 02/04/2018 (Exact Date)   BMI 33.81 kg/m  Gen:  alert, not ill-appearing, no distress, appropriate for age, obese female HEENT: head normocephalic without obvious abnormality, conjunctiva and cornea clear, trachea midline Pulm: Normal work of breathing, normal phonation Neuro: alert and oriented x 3, no tremor MSK: extremities atraumatic, full active ROM of all joints, no edema, tenderness of the lateral proximal left forearm, normal gait and station Skin: intact, no rashes on exposed skin, no jaundice, no cyanosis Psych: well-groomed, cooperative, good eye contact, depressed mood, affect full range, speech is articulate, and thought processes clear and goal-directed    No results found for this or any previous visit (from the past 72 hour(s)). No results found.    Assessment and Plan: 27 y.o. female with   .Veronica Gay was seen today for establish care.  Diagnoses and all orders for this visit:  Encounter to establish care  Hypothyroidism, unspecified type -     Discontinue: levothyroxine (SYNTHROID, LEVOTHROID) 50 MCG tablet; Take 1 tablet (50 mcg total) by mouth daily before breakfast. 30 DAY SUPPLY GIVEN UNTIL LABS HAVE BEEN DONE. -     levothyroxine (SYNTHROID, LEVOTHROID) 50 MCG tablet; Take 1 tablet (50 mcg total) by mouth daily before breakfast.  Chronic fatigue -     ANA -     CK -     Rheumatoid factor -     Cyclic citrul peptide antibody, IgG -     Sedimentation rate -     Urinalysis, Routine w reflex microscopic  Arthralgia of multiple joints -     ANA -     CK -     Rheumatoid factor -     Cyclic citrul peptide antibody, IgG -     Sedimentation rate -     Urinalysis, Routine w reflex microscopic  Generalized anxiety disorder -     DULoxetine (CYMBALTA) 20 MG capsule; Take 1 capsule (20 mg total) by mouth at bedtime for 7  days, THEN 2 capsules (40 mg total) at bedtime for 23 days.  Chronic fatigue syndrome with fibromyalgia -     DULoxetine (CYMBALTA) 20 MG capsule; Take 1 capsule (20 mg total) by mouth at bedtime for 7 days, THEN 2 capsules (40 mg total) at bedtime for 23 days.   - Personally reviewed PMH, PSH, PFH, medications, allergies, HM - Age-appropriate cancer screening: Pap UTD per patient by unknown facility in [facility], [place] - Influenza n/a - Tdap UTD 2017 per patient - PHQ9=12, positive  Chronic Fatigue, Arthralgia of multiple joints Personally reviewed [facility] lab results  from 11/08/17 - CBC, CMP, A1c unremarkable TSH 0.96 Vitamin D 39.8 B12 486 Folate 9.2 Symptoms consistent with myofascial pain syndrome. Discussed we will do laboratory work-up to exclude autoimmune disorder. Starting Duloxetine 20 mg x 1 week, then 40 mg daily, which should benefit co-morbid anxiety Encouraged to continue regular aerobic exercise routine and sleep hygiene  Patient education and anticipatory guidance given Patient agrees with treatment plan Follow-up in 6 weeks or sooner as needed if symptoms worsen or fail to improve  Levonne Hubertharley E. Blimy Napoleon PA-C

## 2018-02-25 LAB — URINALYSIS, ROUTINE W REFLEX MICROSCOPIC
Bilirubin Urine: NEGATIVE
Glucose, UA: NEGATIVE
HGB URINE DIPSTICK: NEGATIVE
KETONES UR: NEGATIVE
LEUKOCYTES UA: NEGATIVE
Nitrite: NEGATIVE
PH: 5.5 (ref 5.0–8.0)
Protein, ur: NEGATIVE
SPECIFIC GRAVITY, URINE: 1.012 (ref 1.001–1.03)

## 2018-02-25 LAB — ANA: Anti Nuclear Antibody(ANA): NEGATIVE

## 2018-02-25 LAB — CK: Total CK: 73 U/L (ref 29–143)

## 2018-02-25 LAB — RHEUMATOID FACTOR: Rhuematoid fact SerPl-aCnc: <14 IU/mL (ref <14)

## 2018-02-25 LAB — CYCLIC CITRUL PEPTIDE ANTIBODY, IGG: Cyclic Citrullin Peptide Ab: <16 UNITS

## 2018-02-25 LAB — SEDIMENTATION RATE: Sed Rate: 11 mm/h (ref 0–20)

## 2018-03-15 ENCOUNTER — Other Ambulatory Visit: Payer: Self-pay | Admitting: Physician Assistant

## 2018-03-15 DIAGNOSIS — G9332 Myalgic encephalomyelitis/chronic fatigue syndrome: Secondary | ICD-10-CM

## 2018-03-15 DIAGNOSIS — M797 Fibromyalgia: Secondary | ICD-10-CM

## 2018-03-15 DIAGNOSIS — R5382 Chronic fatigue, unspecified: Secondary | ICD-10-CM

## 2018-03-15 DIAGNOSIS — F411 Generalized anxiety disorder: Secondary | ICD-10-CM

## 2018-04-04 ENCOUNTER — Other Ambulatory Visit: Payer: Self-pay | Admitting: Physician Assistant

## 2018-04-04 ENCOUNTER — Ambulatory Visit (INDEPENDENT_AMBULATORY_CARE_PROVIDER_SITE_OTHER): Payer: 59 | Admitting: Physician Assistant

## 2018-04-04 ENCOUNTER — Encounter: Payer: Self-pay | Admitting: Physician Assistant

## 2018-04-04 VITALS — BP 123/86 | HR 88 | Wt 192.0 lb

## 2018-04-04 DIAGNOSIS — R5382 Chronic fatigue, unspecified: Secondary | ICD-10-CM

## 2018-04-04 DIAGNOSIS — M797 Fibromyalgia: Secondary | ICD-10-CM

## 2018-04-04 DIAGNOSIS — F411 Generalized anxiety disorder: Secondary | ICD-10-CM

## 2018-04-04 DIAGNOSIS — M2550 Pain in unspecified joint: Secondary | ICD-10-CM | POA: Diagnosis not present

## 2018-04-04 DIAGNOSIS — E559 Vitamin D deficiency, unspecified: Secondary | ICD-10-CM

## 2018-04-04 DIAGNOSIS — Z807 Family history of other malignant neoplasms of lymphoid, hematopoietic and related tissues: Secondary | ICD-10-CM

## 2018-04-04 MED ORDER — CHLORZOXAZONE 500 MG PO TABS: 500.0000 mg | ORAL_TABLET | Freq: Four times a day (QID) | ORAL | 0 refills | Status: DC | PRN

## 2018-04-04 MED ORDER — DULOXETINE HCL 40 MG PO CPEP: 40.0000 mg | ORAL_CAPSULE | Freq: Every day | ORAL | 1 refills | Status: DC

## 2018-04-04 NOTE — Progress Notes (Signed)
HPI:                                                                ALICYN KLANN is a 27 y.o. female who presents to [place] today for arthralgia/anxiety follow-up  This is a pleasant 27 yo F with PMH hypothyroidism, gestational diabetes, polyarthralgias, chronic fatigue, anxiety, vitamin D insufficiency. Has been taking Duloxetine 40 mg daily for about 1 month. Feels like day-to-day anxiety has improved.  Reports 1 panic attack. She has not noticed any improvement in her fatigue or arthralgias. Fatigue is interfering with her quality of life. States she can't seem to stay awake in the early evenings to play with her children. Reports she is having cramping pain in her arches and toes over the last month. Still has arthralgias in her wrists, forearms, hips, knees and shins. Denies joint swelling, redness or warmth.  She is exercising 5 days per week and following a low carb diet. She has lost an additional 5 pounds.  Her rheumatologic work-up was unremarkable: negative CCP, RF, ANA, ESR, UA Her TSH was therapeutic at 0.96  Father has MGUS and fibromyalgia. Diagnosed with MGUS late 40's, has not progressed to multiple myeloma, but he would like her to be tested.   Depression screen [facility] 2/9 04/04/2018 02/21/2018 08/11/2017  Decreased Interest 0 1 1  Down, Depressed, Hopeless 0 1 1  PHQ - 2 Score 0 2 2  Altered sleeping 0 1 0  Tired, decreased energy _0 Change in appetite 1 2 0  Feeling bad or failure about yourself  _1 Trouble concentrating 1 1 0  Moving slowly or fidgety/restless 0 0 1  Suicidal thoughts 0 0 0  PHQ-9 Score _2 GAD 7 : Generalized Anxiety Score 04/04/2018 02/21/2018 08/11/2017  Nervous, Anxious, on Edge _3 Control/stop worrying 0 2 2  Worry too much - different things 0 2 2  Trouble relaxing _4 Restless _5 Easily annoyed or irritable _6 Afraid - awful might happen 0 0 0  Total GAD 7  Score _7 Past Medical History:  Diagnosis Date  . Anxiety   . Complicated migraine    last episode 2012  . History of gestational diabetes   . Hypothyroidism    Past Surgical History:  Procedure Laterality Date  . CESAREAN SECTION    . TONSILLECTOMY     Social History   Tobacco Use  . Smoking status: Never Smoker  . Smokeless tobacco: Never Used  Substance Use Topics  . Alcohol use: No   family history includes Anxiety disorder in her mother and sister; Breast cancer in her maternal aunt and maternal grandmother; Depression in her unknown relative; Diabetes in her father, mother, paternal grandfather, and unknown relative; Fibromyalgia in her father; Hypertension in her unknown relative; Lung cancer in her maternal grandfather and unknown relative; Multiple myeloma in her father; Thyroid disease in her unknown relative.    ROS: negative except as noted in the HPI  Medications: Current Outpatient Medications  Medication Sig Dispense Refill  . chlorzoxazone (PARAFON FORTE DSC) 500 MG tablet  Take 1-1.5 tablets (500-750 mg total) by mouth 4 (four) times daily as needed for muscle spasms. 60 tablet 0  . DULoxetine 40 MG CPEP Take 40 mg by mouth daily. 90 capsule 1  . Ergocalciferol 2000 units CAPS Take 2,000 Units by mouth daily.    Marland Kitchen etonogestrel-ethinyl estradiol (NUVARING) 0.12-0.015 MG/24HR vaginal ring Place 1 each vaginally every 28 (twenty-eight) days. Insert vaginally and leave in place for 3 consecutive weeks, then remove for 1 week.    . levothyroxine (SYNTHROID, LEVOTHROID) 50 MCG tablet Take 1 tablet (50 mcg total) by mouth daily before breakfast. 90 tablet 1   No current facility-administered medications for this visit.    Allergies  Allergen Reactions  . Cephalosporins        Objective:  BP 123/86   Pulse 88   Wt 192 lb (87.1 kg)   BMI 32.96 kg/m  Gen:  alert, not ill-appearing, no distress, appropriate for age, obese female HEENT: head  normocephalic without obvious abnormality, conjunctiva and cornea clear, trachea midline Pulm: Normal work of breathing, normal phonation Neuro: alert and oriented x 3, no tremor MSK: extremities atraumatic, normal gait and station Skin: intact, no rashes on exposed skin, no jaundice, no cyanosis Psych: well-groomed, cooperative, good eye contact, euthymic mood, affect mood-congruent, speech is articulate, and thought processes clear and goal-directed  Lab Results  Component Value Date   CKTOTAL 73 02/21/2018   Lab Results  Component Value Date   ANA NEGATIVE 02/21/2018   RF <14 02/21/2018     No results found for this or any previous visit (from the past 44 hour(s)). No results found.    Assessment and Plan: 27 y.o. female with   .Wilhelmena was seen today for follow-up.  Diagnoses and all orders for this visit:  Arthralgia of multiple joints -     Ambulatory referral to Rheumatology -     chlorzoxazone (PARAFON FORTE DSC) 500 MG tablet; Take 1-1.5 tablets (500-750 mg total) by mouth 4 (four) times daily as needed for muscle spasms.  Generalized anxiety disorder -     DULoxetine 40 MG CPEP; Take 40 mg by mouth daily.  Family history of multiple myeloma -     Protein electrophoresis, serum  Chronic fatigue -     Ambulatory referral to Rheumatology  Chronic fatigue syndrome with fibromyalgia -     DULoxetine 40 MG CPEP; Take 40 mg by mouth daily.  Vitamin D insufficiency   PHQ9=6, GAD7=5, she has had a partial response to Duloxetine in terms of mood and anxiety. Plan to continue 40 mg daily Unfortunately her fatigue and pain have remained largely unchanged. She is adhering to a healthy diet and regular exercise. Her symptoms are consistent with myofascial pain syndrome/CFS. Her rheumatologic work-up was unremarkable. I am referring her to Rheumatology for further management  We also discussed her father's diagnosis of MGUS. Her total protein, alk phos, and UA have been  normal to date. We will proceed with SPEP     Patient education and anticipatory guidance given Patient agrees with treatment plan Follow-up in 6 months for anxiety/depression or sooner as needed if symptoms worsen or fail to improve  Darlyne Russian PA-C

## 2018-04-04 NOTE — Patient Instructions (Signed)
  Chronic Fatigue Syndrome Chronic fatigue syndrome (CFS) is a condition that causes extreme tiredness (fatigue). This fatigue does not improve with rest, and it gets worse with physical or mental activity. You may have several other symptoms along with fatigue. Symptoms may come and go, but they generally last for months. Sometimes, CFS gets better over time, but it can be a lifelong condition. There is no cure, but there are many possible treatments. You will need to work with your health care providers to find a treatment plan that works best for you. What are the causes? The cause of CFS is not known. There may be more than one cause. Possible causes include:  An infection.  An abnormal body defense system (immune system).  Low blood pressure.  Poor diet.  Physical or emotional stress.  What increases the risk? You are more likely to develop this condition if:  You are female.  You are 40?29 years old.  You have a family history of CFS.  You live with a lot of emotional stress.  What are the signs or symptoms? The main symptom of CFS is fatigue that is severe enough to interfere with day-to-day activities. This fatigue does not get better with rest, and it gets worse with physical or mental activity. There are eight other major symptoms of CFS:  Lack of energy (malaise) that lasts more than 24 hours after physical exertion.  Sleep that does not relieve fatigue (unrefreshing sleep).  Short-term memory loss or confusion.  Joint pain without redness or swelling.  Muscle aches.  Headaches.  Painful and swollen glands (lymph nodes) in the neck or under the arms.  Sore throat.  You may also have:  Abdominal cramps, constipation, or diarrhea (irritable bowel).  Chills.  Night sweats.  Vision changes.  Dizziness.  Mental confusion (brain fog).  Clumsiness.  Sensitivity to food, noise, or odors.  Mood swings, depression, or anxiety attacks.  How is  this diagnosed? There are no tests that can diagnose this condition. Your health care provider will make the diagnosis based on your medical history, a physical exam, and a mental health exam. However, it is important to make sure that your symptoms are not caused by another medical condition. You may have lab tests or X-rays to rule out other conditions. For your health care provider to diagnose CFS:  You must have had fatigue for at least 6 straight months.  Fatigue must be your first symptom, and it must be severe enough to interfere with day-to-day activities.  There must be no other cause found for the fatigue.  You must also have at least four of the eight other major symptoms of CFS.  How is this treated? There is no cure for CFS. The condition affects everyone differently. You will need to work with your team of health care providers to find the best treatments for your symptoms. Your team may include your primary care provider, physical and exercise therapists, and mental health therapists. Treatment may include:  Improving sleep with a regular bedtime routine.  Avoiding caffeine, alcohol, and tobacco.  Doing light exercise and stretching during the day.  Taking medicines to help you sleep or to relieve joint or muscle pain.  Learning and practicing relaxation techniques.  Using memory aids or doing brainteasers to improve memory and concentration.  Seeing a mental health therapist to evaluate and treat depression, if necessary.  Trying massage therapy, acupuncture, and movement exercises, such as yoga or tai chi.  Follow   at home:  Activity  Exercise regularly, as told by your health care provider.  Avoid fatigue by pacing yourself during the day and getting enough sleep at night.  Go to bed and get up at the same time every day. Eating and drinking  Avoid caffeine and alcohol.  Avoid heavy meals in the evening.  Eat a well-balanced diet. General  instructions  Take over-the-counter and prescription medicines only as told by your health care provider.  Do not use herbal or dietary supplements unless they are approved by your health care provider.  Maintain a healthy weight.  Avoid stress and use stress-reducing techniques that you learn in therapy.  Do not use any products that contain nicotine or tobacco, such as cigarettes and e-cigarettes. If you need help quitting, ask your health care provider.  Consider joining a CFS support group.  Keep all follow-up visits as told by your health care provider. This is important. Contact a health care provider if:  Your symptoms do not get better or they get worse.  You feel angry, guilty, anxious, or depressed. This information is not intended to replace advice given to you by your health care provider. Make sure you discuss any questions you have with your health care provider. Document Released: 08/11/2004 Document Revised: 03/10/2016 Document Reviewed: 10/12/2015 Elsevier Interactive Patient Education  [employer].

## 2018-04-05 ENCOUNTER — Encounter: Payer: Self-pay | Admitting: Physician Assistant

## 2018-04-06 LAB — PROTEIN ELECTROPHORESIS, SERUM
ALPHA 2: 0.8 g/dL (ref 0.5–0.9)
Albumin ELP: 4.3 g/dL (ref 3.8–4.8)
Alpha 1: 0.3 g/dL (ref 0.2–0.3)
Beta 2: 0.4 g/dL (ref 0.2–0.5)
Beta Globulin: 0.5 g/dL (ref 0.4–0.6)
GAMMA GLOBULIN: 0.9 g/dL (ref 0.8–1.7)
Total Protein: 7.3 g/dL (ref 6.1–8.1)

## 2018-05-29 ENCOUNTER — Encounter: Payer: Self-pay | Admitting: Physician Assistant

## 2018-05-29 ENCOUNTER — Other Ambulatory Visit: Payer: Self-pay | Admitting: Osteopathic Medicine

## 2018-05-30 NOTE — Telephone Encounter (Signed)
Requested medication (s) are due for refill today: yes  Requested medication (s) are on the active medication list: Yes  Last refill:  05/10/18  Future visit scheduled: No  Notes to clinic:  Chart states medication d/c. Still on medication list.    Requested Prescriptions  Pending Prescriptions Disp Refills   NUVARING 0.12-0.015 MG/24HR vaginal ring [Pharmacy Med Name: NUVARING RING  VAGINAL RING] 3 each 3    Sig: INSERT 1 RING VAGINALLY AND LEAVE IN PLACE FOR 3  CONSECUTIVE WEEKS, THEN  REMOVE FOR 1 WEEK AND  REPLACE NEW RING     OB/GYN:  Contraceptives Failed - 05/29/2018  9:38 PM      Failed - Valid encounter within last 12 months    Recent Outpatient Visits          1 month ago Arthralgia of multiple joints   [facility] [place] Carlis Stableummings, Charley Elizabeth, PA-C   3 months ago Encounter to establish care   [facility] [place] Carlis StableCummings, Charley Elizabeth, PA-C   9 months ago Abdominal wall pain in right upper quadrant   [facility] [place] Carlis Stableummings, Charley Elizabeth, [place]   1 year ago Allergic reaction to insect sting, accidental or unintentional, initial encounter   [facility] [place] Sunnie NielsenAlexander, Natalie, DO   1 year ago Encounter for counseling regarding contraception   [facility] [place] Sunnie NielsenAlexander, Natalie, DO             Passed - Last BP in normal range    BP Readings from Last 1 Encounters:  04/04/18 123/86

## 2018-06-06 ENCOUNTER — Other Ambulatory Visit: Payer: Self-pay | Admitting: Physician Assistant

## 2018-06-06 DIAGNOSIS — E039 Hypothyroidism, unspecified: Secondary | ICD-10-CM

## 2018-06-07 ENCOUNTER — Ambulatory Visit: Payer: 59 | Admitting: Physician Assistant

## 2018-06-07 ENCOUNTER — Encounter: Payer: Self-pay | Admitting: Physician Assistant

## 2018-06-07 VITALS — BP 135/88 | HR 105 | Wt 197.0 lb

## 2018-06-07 DIAGNOSIS — E6609 Other obesity due to excess calories: Secondary | ICD-10-CM | POA: Diagnosis not present

## 2018-06-07 DIAGNOSIS — Z7689 Persons encountering health services in other specified circumstances: Secondary | ICD-10-CM

## 2018-06-07 DIAGNOSIS — E039 Hypothyroidism, unspecified: Secondary | ICD-10-CM

## 2018-06-07 DIAGNOSIS — M2550 Pain in unspecified joint: Secondary | ICD-10-CM

## 2018-06-07 DIAGNOSIS — E8881 Metabolic syndrome: Secondary | ICD-10-CM

## 2018-06-07 DIAGNOSIS — R5382 Chronic fatigue, unspecified: Secondary | ICD-10-CM

## 2018-06-07 DIAGNOSIS — Z6833 Body mass index (BMI) 33.0-33.9, adult: Secondary | ICD-10-CM

## 2018-06-07 MED ORDER — SEMAGLUTIDE(0.25 OR 0.5MG/DOS) 2 MG/1.5ML ~~LOC~~ SOPN: PEN_INJECTOR | SUBCUTANEOUS | 0 refills | Status: AC

## 2018-06-07 NOTE — Progress Notes (Signed)
HPI:                                                                Veronica Gay is a 27 y.o. female who presents to [facility]: [facility] today to discuss weight management / arthralgia follow-up  Veronica Gay is a pleasant 27 yo F with PMH of hypothyroidism, vitamin D insufficiency, gestational diabetes, migraines who was recently evaluated by [facility] for polyarthralgias, myalgias and fatigue.  She received a diagnosis of fibromyalgia.  She did not feel great about how the visit went, did not feel like the provider did a thorough exam, and she is not convinced of her diagnosis.  Specifically she would like psoriatic arthritis to be ruled out. She reports history of psoriasis of her scalp, hair thinning, and brittle nails.  The rheumatologist increased her Cymbalta to 60 mg twice a day.  She states she has noticed this has been exceedingly helpful for her anxiety symptoms, but she is still having a lot of joint pain.  Main joints affected are her hips and knees.  She also has myalgias of her forearms and shins bilaterally.  Symptoms have not really changed with duloxetine.  Prior work-up has included: negative CCP, RF, ANA,  Normal ESR, CK, UA  She would also like to discuss medical weight management.  It was also recommended by rheumatology that she work on weight loss. Reports healthiest adult weight is 175 pounds.  Current weight 197 lb. She is very physically active, goes to the gym 4 to 5 days/week.  She has been unable to lose weight despite low-carb diet and exercise.   Past Medical History:  Diagnosis Date  . ANEMIA, IRON DEFICIENCY 04/23/2010   Qualifier: Diagnosis of  By: Madilyn Fireman MD, Barnetta Chapel    . Anxiety   . Complicated migraine    last episode 2012  . Diabetes mellitus without complication ([place])   . History of gestational diabetes   . Hypothyroidism    Past Surgical History:  Procedure Laterality Date  . CESAREAN SECTION    .  TONSILLECTOMY     Social History   Tobacco Use  . Smoking status: Never Smoker  . Smokeless tobacco: Never Used  Substance Use Topics  . Alcohol use: No   family history includes Anxiety disorder in her mother and sister; Breast cancer in her maternal aunt and maternal grandmother; Depression in her unknown relative; Diabetes in her father, mother, paternal grandfather, and unknown relative; Fibromyalgia in her father; Hypertension in her unknown relative; Lung cancer in her maternal grandfather and unknown relative; Multiple myeloma in her father; Thyroid disease in her unknown relative.    ROS: negative except as noted in the HPI  Medications: Current Outpatient Medications  Medication Sig Dispense Refill  . chlorzoxazone (PARAFON FORTE DSC) 500 MG tablet Take 1-1.5 tablets (500-750 mg total) by mouth 4 (four) times daily as needed for muscle spasms. 60 tablet 0  . DULoxetine (CYMBALTA) 60 MG capsule Take 60 mg by mouth 2 (two) times daily.    . Ergocalciferol 2000 units CAPS Take 2,000 Units by mouth daily.    Marland Kitchen levothyroxine (SYNTHROID, LEVOTHROID) 50 MCG tablet Take 1 tablet (50 mcg total) by mouth daily before breakfast. Due for labs 90  tablet 3  . Semaglutide,0.25 or 0.5MG/DOS, (OZEMPIC, 0.25 OR 0.5 MG/DOSE,) 2 MG/1.5ML SOPN Inject 0.25 mg into the skin once a week for 28 days, THEN 0.5 mg once a week for 28 days. 0.25 mg SQ once weekly for 4 weeks then increase to 0.5 mg once weekly. 1 pen 0   No current facility-administered medications for this visit.    Allergies  Allergen Reactions  . Cephalosporins        Objective:  BP 135/88   Pulse (!) 105   Wt 197 lb (89.4 kg)   BMI 33.81 kg/m  Gen:  alert, not ill-appearing, no distress, appropriate for age, obese female HEENT: head normocephalic without obvious abnormality, conjunctiva and cornea clear, trachea midline Pulm: Normal work of breathing, normal phonation Neuro: alert and oriented x 3, no tremor MSK:  extremities atraumatic, normal gait and station Skin: intact, scattered red macules on the scalp without scale, no patchy hair loss, no nail pitting, no rashes or plaques on extremities Psych: well-groomed, cooperative, good eye contact, euthymic mood, affect mood-congruent, speech is articulate, and thought processes clear and goal-directed  Wt Readings from Last 3 Encounters:  06/07/18 197 lb (89.4 kg)  04/04/18 192 lb (87.1 kg)  02/21/18 197 lb (89.4 kg)    Lab Results  Component Value Date   CREATININE 0.60 10/11/2016   BUN 16 10/11/2016   NA 138 10/11/2016   K 3.8 10/11/2016   CL 102 10/11/2016   CO2 24 10/11/2016   Lab Results  Component Value Date   ALT 12 10/11/2016   AST 10 10/11/2016   ALKPHOS 36 10/11/2016   BILITOT 0.4 10/11/2016   No results found for: CHOL, HDL, LDLCALC, LDLDIRECT, TRIG, CHOLHDL  No results found for: HGBA1C  Lab Results  Component Value Date   TSH 0.72 06/07/2018   Lab Results  Component Value Date   ANA NEGATIVE 02/21/2018   RF <14 02/21/2018   Lab Results  Component Value Date   CKTOTAL 73 02/21/2018     No results found for this or any previous visit (from the past 72 hour(s)). No results found.    Assessment and Plan: 27 y.o. female with   .Veronica Gay was seen today for follow-up.  Diagnoses and all orders for this visit:  Encounter for weight management  Class 1 obesity due to excess calories without serious comorbidity with body mass index (BMI) of 33.0 to 33.9 in adult  Metabolic syndrome -     YFVCBSWHQPR,9.16 or 0.5MG/DOS, (OZEMPIC, 0.25 OR 0.5 MG/DOSE,) 2 MG/1.5ML SOPN; Inject 0.25 mg into the skin once a week for 28 days, THEN 0.5 mg once a week for 28 days. 0.25 mg SQ once weekly for 4 weeks then increase to 0.5 mg once weekly.  Hypothyroidism, unspecified type -     Thyroid Panel With TSH  Arthralgia of multiple joints  Chronic fatigue   BMI 33 - counseled on weight loss today - cont regular aerobic  exercise - recommend 1800 calorie/day to lose 1 pound per week - recommend anti-inflammatory diet given her comorbid myalgia/arthralgia - we will see if we can get Ozempic approved since this would be a safe option with her current dose of Duloxetine. Would avoid or use caution with Contrave, Qsymia, Phentermine products  Arthralgia/Myalgia/chronic fatigue - we discussed that fibromyalgia is still the most likely diagnosis. We discussed that there is a lot of stigma associated with this diagnosis, but that it is a true medical condition and there are treatment  options - she is already doing a great job of adhering to the lifestyle recommendations for fibromyalgia - I think it is reasonable to refer her for a second opinion to rule out psoriatic arthritis  Patient education and anticipatory guidance given Patient agrees with treatment plan Follow-up in 6 weeks for weight mgmt or sooner as needed if symptoms worsen or fail to improve  Darlyne Russian PA-C

## 2018-06-08 ENCOUNTER — Other Ambulatory Visit: Payer: Self-pay | Admitting: Osteopathic Medicine

## 2018-06-08 DIAGNOSIS — E039 Hypothyroidism, unspecified: Secondary | ICD-10-CM

## 2018-06-08 LAB — THYROID PANEL WITH TSH
FREE THYROXINE INDEX: 2.4 (ref 1.4–3.8)
T3 UPTAKE: 28 % (ref 22–35)
T4, Total: 8.7 ug/dL (ref 5.1–11.9)
TSH: 0.72 m[IU]/L

## 2018-06-08 MED ORDER — LEVOTHYROXINE SODIUM 50 MCG PO TABS: 50.0000 ug | ORAL_TABLET | Freq: Every day | ORAL | 3 refills | Status: DC

## 2018-06-13 ENCOUNTER — Encounter: Payer: Self-pay | Admitting: Physician Assistant

## 2018-06-13 ENCOUNTER — Telehealth: Payer: Self-pay

## 2018-06-13 DIAGNOSIS — R5382 Chronic fatigue, unspecified: Secondary | ICD-10-CM

## 2018-06-13 DIAGNOSIS — E6609 Other obesity due to excess calories: Secondary | ICD-10-CM | POA: Insufficient documentation

## 2018-06-13 DIAGNOSIS — M2550 Pain in unspecified joint: Secondary | ICD-10-CM

## 2018-06-13 DIAGNOSIS — Z6833 Body mass index (BMI) 33.0-33.9, adult: Secondary | ICD-10-CM

## 2018-06-13 MED ORDER — ORLISTAT 120 MG PO CAPS: 120.0000 mg | ORAL_CAPSULE | Freq: Three times a day (TID) | ORAL | 0 refills | Status: DC

## 2018-06-13 NOTE — Telephone Encounter (Signed)
Pt notified and will contact insurance. -EH/RMA

## 2018-06-13 NOTE — Telephone Encounter (Signed)
Pt reports that ozempic is too expensive.  She is asking for something cheaper to be sent to pharmacy. Please advise. -EH/RMA

## 2018-06-13 NOTE — Telephone Encounter (Signed)
She needs to contact her insurance and find out if any weight loss medicaitons are on her formulary

## 2018-06-13 NOTE — Addendum Note (Signed)
Addended by: Gena FrayUMMINGS, CHARLEY E on: 06/13/2018 02:11 PM   Modules accepted: Orders

## 2018-07-20 ENCOUNTER — Encounter: Payer: Self-pay | Admitting: Physician Assistant

## 2018-08-01 NOTE — Telephone Encounter (Signed)
Pt is requesting a updated status of her med PA. Please advise. -EH/RMA

## 2018-08-02 NOTE — Telephone Encounter (Signed)
Medication not covered by insurance. Message has been sent to the patient. I have tried to call the patient and the number keeps getting cut off.

## 2018-08-02 NOTE — Telephone Encounter (Signed)
Any update on this?

## 2018-11-01 ENCOUNTER — Encounter (INDEPENDENT_AMBULATORY_CARE_PROVIDER_SITE_OTHER): Payer: 59 | Admitting: Physician Assistant

## 2018-11-01 DIAGNOSIS — Z6833 Body mass index (BMI) 33.0-33.9, adult: Secondary | ICD-10-CM

## 2018-11-01 DIAGNOSIS — E559 Vitamin D deficiency, unspecified: Secondary | ICD-10-CM

## 2018-11-01 DIAGNOSIS — E039 Hypothyroidism, unspecified: Secondary | ICD-10-CM

## 2018-11-01 DIAGNOSIS — N926 Irregular menstruation, unspecified: Secondary | ICD-10-CM

## 2018-11-01 DIAGNOSIS — R5383 Other fatigue: Secondary | ICD-10-CM | POA: Diagnosis not present

## 2018-11-01 DIAGNOSIS — Z862 Personal history of diseases of the blood and blood-forming organs and certain disorders involving the immune mechanism: Secondary | ICD-10-CM

## 2018-11-01 DIAGNOSIS — E6609 Other obesity due to excess calories: Secondary | ICD-10-CM

## 2018-11-01 DIAGNOSIS — Z8632 Personal history of gestational diabetes: Secondary | ICD-10-CM

## 2018-11-12 ENCOUNTER — Other Ambulatory Visit: Payer: Self-pay | Admitting: Physician Assistant

## 2018-11-15 ENCOUNTER — Other Ambulatory Visit: Payer: Self-pay | Admitting: Physician Assistant

## 2018-11-15 DIAGNOSIS — Z8632 Personal history of gestational diabetes: Secondary | ICD-10-CM

## 2018-11-15 DIAGNOSIS — Z6833 Body mass index (BMI) 33.0-33.9, adult: Secondary | ICD-10-CM

## 2018-11-15 DIAGNOSIS — E6609 Other obesity due to excess calories: Secondary | ICD-10-CM

## 2018-11-15 DIAGNOSIS — N926 Irregular menstruation, unspecified: Secondary | ICD-10-CM

## 2018-11-15 DIAGNOSIS — E559 Vitamin D deficiency, unspecified: Secondary | ICD-10-CM

## 2018-11-15 DIAGNOSIS — Z862 Personal history of diseases of the blood and blood-forming organs and certain disorders involving the immune mechanism: Secondary | ICD-10-CM

## 2018-11-15 DIAGNOSIS — R5383 Other fatigue: Secondary | ICD-10-CM

## 2018-11-15 NOTE — Telephone Encounter (Signed)
Virtual Visit via Telephone Note  I connected with Moise Boring via MyChart on 11/07/18, 11/12/18 and 11/15/18       History of Present Illness: CC: weight management  See MyChart correspondence for details     Assessment and Plan: .Diagnoses and all orders for this visit:  Fatigue, unspecified type -     Testosterone, Total, LC/MS/MS; Future -     Testosterone, Free, LC/MS/MS; Future -     Hemoglobin A1c; Future -     Lipid Panel w/reflex Direct LDL; Future -     VITAMIN D 25 Hydroxy (Vit-D Deficiency, Fractures); Future -     CBC; Future -     Fe+TIBC+Fer; Future -     FSH/LH; Future  Hypothyroidism, unspecified type  History of gestational diabetes -     Hemoglobin A1c; Future  Class 1 obesity due to excess calories without serious comorbidity with body mass index (BMI) of 33.0 to 33.9 in adult -     Hemoglobin A1c; Future -     Lipid Panel w/reflex Direct LDL; Future  Vitamin D insufficiency -     VITAMIN D 25 Hydroxy (Vit-D Deficiency, Fractures); Future  History of iron deficiency anemia -     CBC; Future -     Fe+TIBC+Fer; Future  Menstrual irregularity -     Testosterone, Total, LC/MS/MS; Future -     Testosterone, Free, LC/MS/MS; Future -     FSH/LH; Future     Follow Up Instructions:    I discussed the assessment and treatment plan with the patient. The patient was provided an opportunity to ask questions and all were answered. The patient agreed with the plan and demonstrated an understanding of the instructions.   The patient was advised to call back or seek an in-person evaluation if the symptoms worsen or if the condition fails to improve as anticipated.  I provided 11-20 minutes of non-face-to-face time during this encounter.   Carlis Stable, [place]

## 2018-11-30 ENCOUNTER — Encounter: Payer: Self-pay | Admitting: Physician Assistant

## 2018-11-30 DIAGNOSIS — E8881 Metabolic syndrome: Secondary | ICD-10-CM

## 2018-12-03 ENCOUNTER — Encounter: Payer: Self-pay | Admitting: Physician Assistant

## 2018-12-03 ENCOUNTER — Other Ambulatory Visit: Payer: Self-pay | Admitting: Physician Assistant

## 2018-12-03 DIAGNOSIS — D500 Iron deficiency anemia secondary to blood loss (chronic): Secondary | ICD-10-CM

## 2018-12-03 DIAGNOSIS — E785 Hyperlipidemia, unspecified: Secondary | ICD-10-CM

## 2018-12-03 HISTORY — DX: Hyperlipidemia, unspecified: E78.5

## 2018-12-03 LAB — LIPID PANEL W/REFLEX DIRECT LDL
Cholesterol: 167 mg/dL (ref <200)
HDL: 43 mg/dL — ABNORMAL LOW (ref >=50)
LDL Cholesterol (Calc): 106 mg/dL (calc) — ABNORMAL HIGH
Non-HDL Cholesterol (Calc): 124 mg/dL (calc) (ref <130)
Total CHOL/HDL Ratio: 3.9 (calc) (ref <5.0)
Triglycerides: 86 mg/dL (ref <150)

## 2018-12-03 LAB — CBC
HCT: 35 % (ref 35.0–45.0)
Hemoglobin: 11.6 g/dL — ABNORMAL LOW (ref 11.7–15.5)
MCH: 26 pg — ABNORMAL LOW (ref 27.0–33.0)
MCHC: 33.1 g/dL (ref 32.0–36.0)
MCV: 78.5 fL — ABNORMAL LOW (ref 80.0–100.0)
MPV: 10.1 fL (ref 7.5–12.5)
Platelets: 310 10*3/uL (ref 140–400)
RBC: 4.46 10*6/uL (ref 3.80–5.10)
RDW: 15.3 % — ABNORMAL HIGH (ref 11.0–15.0)
WBC: 6.3 10*3/uL (ref 3.8–10.8)

## 2018-12-03 LAB — VITAMIN D 25 HYDROXY (VIT D DEFICIENCY, FRACTURES): Vit D, 25-Hydroxy: 30 ng/mL (ref 30–100)

## 2018-12-03 LAB — IRON,TIBC AND FERRITIN PANEL
%SAT: 14 % (calc) — ABNORMAL LOW (ref 16–45)
Ferritin: 4 ng/mL — ABNORMAL LOW (ref 16–154)
Iron: 62 ug/dL (ref 40–190)
TIBC: 445 mcg/dL (calc) (ref 250–450)

## 2018-12-03 LAB — FSH/LH
FSH: 5.4 m[IU]/mL
LH: 5.1 m[IU]/mL

## 2018-12-03 LAB — HEMOGLOBIN A1C
Hgb A1c MFr Bld: 5.5 % of total Hgb (ref <5.7)
Mean Plasma Glucose: 111 (calc)
eAG (mmol/L): 6.2 (calc)

## 2018-12-03 LAB — TESTOSTERONE, TOTAL, LC/MS/MS: Testosterone, Total, LC-MS-MS: 47 ng/dL — ABNORMAL HIGH (ref 2–45)

## 2018-12-03 LAB — TESTOSTERONE, FREE: TESTOSTERONE FREE: 5.6 pg/mL — ABNORMAL HIGH (ref 0.2–5.0)

## 2018-12-03 MED ORDER — SEMAGLUTIDE(0.25 OR 0.5MG/DOS) 2 MG/1.5ML ~~LOC~~ SOPN: PEN_INJECTOR | SUBCUTANEOUS | 0 refills | Status: DC

## 2018-12-03 MED ORDER — FERROUS SULFATE 325 (65 FE) MG PO TBEC: DELAYED_RELEASE_TABLET | ORAL | 11 refills | Status: DC

## 2018-12-17 ENCOUNTER — Ambulatory Visit: Payer: Self-pay | Admitting: Physician Assistant

## 2018-12-18 ENCOUNTER — Ambulatory Visit: Payer: 59 | Admitting: Physician Assistant

## 2018-12-18 DIAGNOSIS — Z111 Encounter for screening for respiratory tuberculosis: Secondary | ICD-10-CM | POA: Diagnosis not present

## 2018-12-18 NOTE — Progress Notes (Signed)
Established Patient Office Visit  Subjective:  Patient ID: Veronica Gay, female    DOB: [birth year]  Age: 28 y.o. MRN: [id number]  CC:  Chief Complaint  Patient presents with  . PPD Placement    HPI BERKLEE BATTEY presents for PPD placement.   Past Medical History:  Diagnosis Date  . ANEMIA, IRON DEFICIENCY 04/23/2010   Qualifier: Diagnosis of  By: Madilyn Fireman MD, Barnetta Chapel    . Anxiety   . Complicated migraine    last episode 2012  . Diabetes mellitus without complication ([place])   . Dyslipidemia 12/03/2018  . History of gestational diabetes   . Hypothyroidism     Past Surgical History:  Procedure Laterality Date  . CESAREAN SECTION    . TONSILLECTOMY      Family History  Problem Relation Age of Onset  . Lung cancer Unknown        grandfather  . Depression Unknown        mom,sister  . Diabetes Unknown        grandmother,grandfather  . Hypertension Unknown        father  . Thyroid disease Unknown        father  . Diabetes Mother   . Anxiety disorder Mother   . Diabetes Father   . Fibromyalgia Father   . Multiple myeloma Father   . Breast cancer Maternal Aunt   . Breast cancer Maternal Grandmother   . Lung cancer Maternal Grandfather   . Diabetes Paternal Grandfather   . Anxiety disorder Sister     Social History   Socioeconomic History  . Marital status: Married    Spouse name: Not on file  . Number of children: Not on file  . Years of education: Not on file  . Highest education level: Not on file  Occupational History  . Not on file  Social Needs  . Financial resource strain: Not on file  . Food insecurity:    Worry: Not on file    Inability: Not on file  . Transportation needs:    Medical: Not on file    Non-medical: Not on file  Tobacco Use  . Smoking status: Never Smoker  . Smokeless tobacco: Never Used  Substance and Sexual Activity  . Alcohol use: No  . Drug use: No  . Sexual activity: Yes    Birth control/protection: Condom, Other-see  comments  Lifestyle  . Physical activity:    Days per week: Not on file    Minutes per session: Not on file  . Stress: Not on file  Relationships  . Social connections:    Talks on phone: Not on file    Gets together: Not on file    Attends religious service: Not on file    Active member of club or organization: Not on file    Attends meetings of clubs or organizations: Not on file    Relationship status: Not on file  . Intimate partner violence:    Fear of current or ex partner: Not on file    Emotionally abused: Not on file    Physically abused: Not on file    Forced sexual activity: Not on file  Other Topics Concern  . Not on file  Social History Narrative  . Not on file    Outpatient Medications Prior to Visit  Medication Sig Dispense Refill  . DULoxetine (CYMBALTA) 60 MG capsule Take 60 mg by mouth 2 (two) times daily.    . Ergocalciferol 2000  units CAPS Take 2,000 Units by mouth daily.    . ferrous sulfate 325 (65 FE) MG EC tablet Take 1 tab PO 1-2 times daily with a meal every other day 90 tablet 11  . levothyroxine (SYNTHROID, LEVOTHROID) 50 MCG tablet Take 1 tablet (50 mcg total) by mouth daily before breakfast. Due for labs 90 tablet 3  . Semaglutide,0.25 or 0.5MG/DOS, (OZEMPIC, 0.25 OR 0.5 MG/DOSE,) 2 MG/1.5ML SOPN Inject 0.25 mg into the skin once a week for 28 days, THEN 0.5 mg once a week for 28 days. 0.25 mg SQ once weekly for 4 weeks then increase to 0.5 mg once weekly. 1 pen 0  . chlorzoxazone (PARAFON FORTE DSC) 500 MG tablet Take 1-1.5 tablets (500-750 mg total) by mouth 4 (four) times daily as needed for muscle spasms. 60 tablet 0   No facility-administered medications prior to visit.     Allergies  Allergen Reactions  . Cephalosporins     ROS Review of Systems    Objective:    Physical Exam  There were no vitals taken for this visit. Wt Readings from Last 3 Encounters:  06/07/18 197 lb (89.4 kg)  04/04/18 192 lb (87.1 kg)  02/21/18 197 lb  (89.4 kg)     Health Maintenance Due  Topic Date Due  . HIV Screening  11/11/2003  . PAP SMEAR-Modifier  11/15/2013    There are no preventive care reminders to display for this patient.  Lab Results  Component Value Date   TSH 0.72 06/07/2018   Lab Results  Component Value Date   WBC 6.3 11/30/2018   HGB 11.6 (L) 11/30/2018   HCT 35.0 11/30/2018   MCV 78.5 (L) 11/30/2018   PLT 310 11/30/2018   Lab Results  Component Value Date   NA 138 10/11/2016   K 3.8 10/11/2016   CO2 24 10/11/2016   GLUCOSE 71 10/11/2016   BUN 16 10/11/2016   CREATININE 0.60 10/11/2016   BILITOT 0.4 10/11/2016   ALKPHOS 36 10/11/2016   AST 10 10/11/2016   ALT 12 10/11/2016   PROT 7.3 04/04/2018   ALBUMIN 4.5 10/11/2016   CALCIUM 9.4 10/11/2016   Lab Results  Component Value Date   CHOL 167 11/30/2018   Lab Results  Component Value Date   HDL 43 (L) 11/30/2018   Lab Results  Component Value Date   LDLCALC 106 (H) 11/30/2018   Lab Results  Component Value Date   TRIG 86 11/30/2018   Lab Results  Component Value Date   CHOLHDL 3.9 11/30/2018   Lab Results  Component Value Date   HGBA1C 5.5 11/30/2018      Assessment & Plan:  PPD placement for TB screening - Patient tolerated injection well without complications. Patient advised to schedule next injection 2-3 days from today.    Problem List Items Addressed This Visit    None    Visit Diagnoses    Screening-pulmonary TB    -  Primary   Relevant Orders   PPD      No orders of the defined types were placed in this encounter.   Follow-up: Return in about 2 days (around 12/20/2018) for PPD read. Durene Romans, Monico Blitz, [place]

## 2018-12-20 ENCOUNTER — Ambulatory Visit (INDEPENDENT_AMBULATORY_CARE_PROVIDER_SITE_OTHER): Payer: 59 | Admitting: Physician Assistant

## 2018-12-20 VITALS — BP 140/87 | HR 86

## 2018-12-20 DIAGNOSIS — Z111 Encounter for screening for respiratory tuberculosis: Secondary | ICD-10-CM

## 2018-12-20 LAB — TB SKIN TEST
Induration: 0 mm
TB Skin Test: NEGATIVE

## 2018-12-20 NOTE — Progress Notes (Signed)
Established Patient Office Visit  Subjective:  Patient ID: Veronica Gay, female    DOB: 12-19-1990  Age: 28 y.o. MRN: [id number]  CC:  Chief Complaint  Patient presents with  . PPD Reading    HPI Veronica Gay presents for PPD reading.   Past Medical History:  Diagnosis Date  . ANEMIA, IRON DEFICIENCY 04/23/2010   Qualifier: Diagnosis of  By: Madilyn Fireman MD, Barnetta Chapel    . Anxiety   . Complicated migraine    last episode 2012  . Diabetes mellitus without complication ([place])   . Dyslipidemia 12/03/2018  . History of gestational diabetes   . Hypothyroidism     Past Surgical History:  Procedure Laterality Date  . CESAREAN SECTION    . TONSILLECTOMY      Family History  Problem Relation Age of Onset  . Lung cancer Unknown        grandfather  . Depression Unknown        mom,sister  . Diabetes Unknown        grandmother,grandfather  . Hypertension Unknown        father  . Thyroid disease Unknown        father  . Diabetes Mother   . Anxiety disorder Mother   . Diabetes Father   . Fibromyalgia Father   . Multiple myeloma Father   . Breast cancer Maternal Aunt   . Breast cancer Maternal Grandmother   . Lung cancer Maternal Grandfather   . Diabetes Paternal Grandfather   . Anxiety disorder Sister     Social History   Socioeconomic History  . Marital status: Married    Spouse name: Not on file  . Number of children: Not on file  . Years of education: Not on file  . Highest education level: Not on file  Occupational History  . Not on file  Social Needs  . Financial resource strain: Not on file  . Food insecurity:    Worry: Not on file    Inability: Not on file  . Transportation needs:    Medical: Not on file    Non-medical: Not on file  Tobacco Use  . Smoking status: Never Smoker  . Smokeless tobacco: Never Used  Substance and Sexual Activity  . Alcohol use: No  . Drug use: No  . Sexual activity: Yes    Birth control/protection: Condom, Other-see  comments  Lifestyle  . Physical activity:    Days per week: Not on file    Minutes per session: Not on file  . Stress: Not on file  Relationships  . Social connections:    Talks on phone: Not on file    Gets together: Not on file    Attends religious service: Not on file    Active member of club or organization: Not on file    Attends meetings of clubs or organizations: Not on file    Relationship status: Not on file  . Intimate partner violence:    Fear of current or ex partner: Not on file    Emotionally abused: Not on file    Physically abused: Not on file    Forced sexual activity: Not on file  Other Topics Concern  . Not on file  Social History Narrative  . Not on file    Outpatient Medications Prior to Visit  Medication Sig Dispense Refill  . DULoxetine (CYMBALTA) 60 MG capsule Take 60 mg by mouth 2 (two) times daily.    . Ergocalciferol 2000  units CAPS Take 2,000 Units by mouth daily.    . ferrous sulfate 325 (65 FE) MG EC tablet Take 1 tab PO 1-2 times daily with a meal every other day 90 tablet 11  . levothyroxine (SYNTHROID, LEVOTHROID) 50 MCG tablet Take 1 tablet (50 mcg total) by mouth daily before breakfast. Due for labs 90 tablet 3  . Semaglutide,0.25 or 0.5MG/DOS, (OZEMPIC, 0.25 OR 0.5 MG/DOSE,) 2 MG/1.5ML SOPN Inject 0.25 mg into the skin once a week for 28 days, THEN 0.5 mg once a week for 28 days. 0.25 mg SQ once weekly for 4 weeks then increase to 0.5 mg once weekly. 1 pen 0   No facility-administered medications prior to visit.     Allergies  Allergen Reactions  . Cephalosporins     ROS Review of Systems    Objective:    Physical Exam  BP 140/87   Pulse 86   SpO2 98%  Wt Readings from Last 3 Encounters:  06/07/18 197 lb (89.4 kg)  04/04/18 192 lb (87.1 kg)  02/21/18 197 lb (89.4 kg)     Health Maintenance Due  Topic Date Due  . HIV Screening  11/11/2003  . PAP SMEAR-Modifier  11/15/2013    There are no preventive care reminders to  display for this patient.  Lab Results  Component Value Date   TSH 0.72 06/07/2018   Lab Results  Component Value Date   WBC 6.3 11/30/2018   HGB 11.6 (L) 11/30/2018   HCT 35.0 11/30/2018   MCV 78.5 (L) 11/30/2018   PLT 310 11/30/2018   Lab Results  Component Value Date   NA 138 10/11/2016   K 3.8 10/11/2016   CO2 24 10/11/2016   GLUCOSE 71 10/11/2016   BUN 16 10/11/2016   CREATININE 0.60 10/11/2016   BILITOT 0.4 10/11/2016   ALKPHOS 36 10/11/2016   AST 10 10/11/2016   ALT 12 10/11/2016   PROT 7.3 04/04/2018   ALBUMIN 4.5 10/11/2016   CALCIUM 9.4 10/11/2016   Lab Results  Component Value Date   CHOL 167 11/30/2018   Lab Results  Component Value Date   HDL 43 (L) 11/30/2018   Lab Results  Component Value Date   LDLCALC 106 (H) 11/30/2018   Lab Results  Component Value Date   TRIG 86 11/30/2018   Lab Results  Component Value Date   CHOLHDL 3.9 11/30/2018   Lab Results  Component Value Date   HGBA1C 5.5 11/30/2018      Assessment & Plan:  Screening TB -PPD negative with 0 mm    Problem List Items Addressed This Visit    None    Visit Diagnoses    Screening-pulmonary TB    -  Primary      No orders of the defined types were placed in this encounter.   Follow-up: No follow-ups on file.    Veronica Gay, [place]

## 2018-12-24 ENCOUNTER — Encounter: Payer: Self-pay | Admitting: Physician Assistant

## 2018-12-25 ENCOUNTER — Other Ambulatory Visit: Payer: Self-pay | Admitting: Physician Assistant

## 2018-12-25 ENCOUNTER — Ambulatory Visit (INDEPENDENT_AMBULATORY_CARE_PROVIDER_SITE_OTHER): Payer: 59 | Admitting: Physician Assistant

## 2018-12-25 ENCOUNTER — Encounter: Payer: Self-pay | Admitting: Physician Assistant

## 2018-12-25 VITALS — BP 126/82 | HR 82 | Temp 98.1°F | Resp 14 | Wt 212.0 lb

## 2018-12-25 DIAGNOSIS — E282 Polycystic ovarian syndrome: Secondary | ICD-10-CM | POA: Diagnosis not present

## 2018-12-25 DIAGNOSIS — E8881 Metabolic syndrome: Secondary | ICD-10-CM | POA: Diagnosis not present

## 2018-12-25 DIAGNOSIS — E039 Hypothyroidism, unspecified: Secondary | ICD-10-CM | POA: Diagnosis not present

## 2018-12-25 DIAGNOSIS — N946 Dysmenorrhea, unspecified: Secondary | ICD-10-CM

## 2018-12-25 DIAGNOSIS — L700 Acne vulgaris: Secondary | ICD-10-CM

## 2018-12-25 DIAGNOSIS — L680 Hirsutism: Secondary | ICD-10-CM | POA: Insufficient documentation

## 2018-12-25 DIAGNOSIS — D500 Iron deficiency anemia secondary to blood loss (chronic): Secondary | ICD-10-CM

## 2018-12-25 DIAGNOSIS — E668 Other obesity: Secondary | ICD-10-CM

## 2018-12-25 MED ORDER — SPIRONOLACTONE 50 MG PO TABS: 50.0000 mg | ORAL_TABLET | Freq: Every day | ORAL | 0 refills | Status: DC

## 2018-12-25 MED ORDER — SEMAGLUTIDE(0.25 OR 0.5MG/DOS) 2 MG/1.5ML ~~LOC~~ SOPN: 0.5000 mg | PEN_INJECTOR | SUBCUTANEOUS | 0 refills | Status: DC

## 2018-12-25 MED ORDER — DOXYCYCLINE HYCLATE 100 MG PO TABS: 100.0000 mg | ORAL_TABLET | Freq: Every day | ORAL | 0 refills | Status: AC

## 2018-12-25 MED ORDER — LEVOTHYROXINE SODIUM 50 MCG PO TABS: 50.0000 ug | ORAL_TABLET | Freq: Every day | ORAL | 1 refills | Status: DC

## 2018-12-25 NOTE — Progress Notes (Signed)
HPI:                                                                Veronica Gay is a 28 y.o. female who presents to [facility]: [facility] today "discuss PCOS treatment"  Pleasant 28 yo F with PMH of hypothyroidism, PCOS, dyslipidemia, obesity, gestational diabetes, fibromyalgia, migraines, anxiety, IDA   She was diagnosed with PCOS by previous OB/GYN in her 60's. She was treated with Mirena, Nuvaring and OCP. States all birth controls worsened her anxiety and made her feel "crazy."  She is G2P2 and has no history of infertility or miscarriage. Her menses are regular each month lasting 5 days, but she endorses menorrhagia and dysmenorrhea. She currently takes Advil prn for menstrual cramps. She uses condoms for contraception. She is not planning on having additional children.  She reports history of severe cystic acne that was treated with Accutane approx 8 years ago. Recently has noticed break-outs of her chest and upper back. She bathes with Dove sensitive skin currently.  She is also bothered by excess facial hair growth in her chin/upper lip area.  She has been taking Ozempic for weight loss/metabolic syndrome for about 3 weeks. She has had some nausea, but it is tolerable. Has not had any noticeable weight loss yet.  She is requesting refills of medication because she will have an insurance lapse in July.  Past Medical History:  Diagnosis Date  . ANEMIA, IRON DEFICIENCY 04/23/2010   Qualifier: Diagnosis of  By: Madilyn Fireman MD, Barnetta Chapel    . Anxiety   . Complicated migraine    last episode 2012  . Diabetes mellitus without complication ([place])   . Dyslipidemia 12/03/2018  . History of gestational diabetes   . Hypothyroidism    Past Surgical History:  Procedure Laterality Date  . CESAREAN SECTION    . TONSILLECTOMY     Social History   Tobacco Use  . Smoking status: Never Smoker  . Smokeless tobacco: Never Used  Substance Use Topics  .  Alcohol use: No   family history includes Anxiety disorder in her mother and sister; Breast cancer in her maternal aunt and maternal grandmother; Depression in her unknown relative; Diabetes in her father, mother, paternal grandfather, and unknown relative; Fibromyalgia in her father; Hypertension in her unknown relative; Lung cancer in her maternal grandfather and unknown relative; Multiple myeloma in her father; Thyroid disease in her unknown relative.    ROS: negative except as noted in the HPI  Medications: Current Outpatient Medications  Medication Sig Dispense Refill  . doxycycline (VIBRA-TABS) 100 MG tablet Take 1 tablet (100 mg total) by mouth daily for 7 days. 90 tablet 0  . DULoxetine (CYMBALTA) 60 MG capsule Take 60 mg by mouth 2 (two) times daily.    . Ergocalciferol 2000 units CAPS Take 2,000 Units by mouth daily.    . ferrous sulfate 325 (65 FE) MG EC tablet Take 1 tab PO 1-2 times daily with a meal every other day 90 tablet 11  . levothyroxine (SYNTHROID) 50 MCG tablet Take 1 tablet (50 mcg total) by mouth daily before breakfast. Due for labs 90 tablet 1  . Semaglutide,0.25 or 0.5MG/DOS, (OZEMPIC, 0.25 OR 0.5 MG/DOSE,) 2 MG/1.5ML SOPN Inject 0.5 mg  into the skin once a week. 3 mL 0  . spironolactone (ALDACTONE) 50 MG tablet Take 1 tablet (50 mg total) by mouth at bedtime. 90 tablet 0   No current facility-administered medications for this visit.    Allergies  Allergen Reactions  . Cephalosporins        Objective:  BP 126/82   Pulse 82   Temp 98.1 F (36.7 C) (Oral)   Resp 14   Wt 212 lb (96.2 kg)   LMP 12/20/2018 (Exact Date)   BMI 36.39 kg/m  Gen:  alert, not ill-appearing, no distress, appropriate for age, obese female HEENT: head normocephalic without obvious abnormality, conjunctiva and cornea clear, trachea midline Pulm: Normal work of breathing, normal phonation Neuro: alert and oriented x 3, no tremor MSK: extremities atraumatic, normal gait and  station Skin: intact, mild cystic acne of upper back Psych: well-groomed, cooperative, good eye contact, euthymic mood, affect mood-congruent, speech is articulate, and thought processes clear and goal-directed  Wt Readings from Last 3 Encounters:  12/25/18 212 lb (96.2 kg)  06/07/18 197 lb (89.4 kg)  04/04/18 192 lb (87.1 kg)   Lab Results  Component Value Date   CREATININE 0.60 10/11/2016   BUN 16 10/11/2016   NA 138 10/11/2016   K 3.8 10/11/2016   CL 102 10/11/2016   CO2 24 10/11/2016   Lab Results  Component Value Date   ALT 12 10/11/2016   AST 10 10/11/2016   ALKPHOS 36 10/11/2016   BILITOT 0.4 10/11/2016   Lab Results  Component Value Date   HGBA1C 5.5 11/30/2018     Lab Results  Component Value Date   TSH 0.72 06/07/2018   Lab Results  Component Value Date   CHOL 167 11/30/2018   HDL 43 (L) 11/30/2018   LDLCALC 106 (H) 11/30/2018   TRIG 86 11/30/2018   CHOLHDL 3.9 11/30/2018   Lab Results  Component Value Date   WBC 6.3 11/30/2018   HGB 11.6 (L) 11/30/2018   HCT 35.0 11/30/2018   MCV 78.5 (L) 11/30/2018   PLT 310 11/30/2018   Lab Results  Component Value Date   IRON 62 11/30/2018   TIBC 445 11/30/2018   FERRITIN 4 (L) 11/30/2018     No results found for this or any previous visit (from the past 72 hour(s)). No results found.    Assessment and Plan: 28 y.o. female with   .Babs was seen today for treatment planning.  Diagnoses and all orders for this visit:  PCOS (polycystic ovarian syndrome) -     spironolactone (ALDACTONE) 50 MG tablet; Take 1 tablet (50 mg total) by mouth at bedtime. -     Ambulatory referral to Endocrinology  Hypothyroidism, unspecified type -     levothyroxine (SYNTHROID) 50 MCG tablet; Take 1 tablet (50 mcg total) by mouth daily before breakfast. Due for labs -     Ambulatory referral to Endocrinology  Metabolic syndrome -     [id number],3.88 or 0.5MG/DOS, (OZEMPIC, 0.25 OR 0.5 MG/DOSE,) 2 MG/1.5ML SOPN;  Inject 0.5 mg into the skin once a week. -     Ambulatory referral to Endocrinology  Acne vulgaris -     doxycycline (VIBRA-TABS) 100 MG tablet; Take 1 tablet (100 mg total) by mouth daily for 7 days.  Hirsutism -     spironolactone (ALDACTONE) 50 MG tablet; Take 1 tablet (50 mg total) by mouth at bedtime.  Obesity due to endocrine disorder  Dysmenorrhea  Iron deficiency anemia due to chronic  blood loss   PCOS She is most bothered by hirsutism, acne, and weight gain She does not have menstrual irregularities/amenorrhea She is very nervous/hesitatnt to try hormonal birth control Will treat hirsutism and acne with spironolactone and doxycycline and continue Ozempic for weight management Referral placed to endocrinology for further eval given co-morbid hypothyroidism and metabolic syndrome  IDA Cont ferrous sulfate bid every other day Recheck labs in 8 weeks  Patient education and anticipatory guidance given Patient agrees with treatment plan Follow-up in 2 months for weight check or sooner as needed if symptoms worsen or fail to improve  Darlyne Russian PA-C

## 2019-02-19 ENCOUNTER — Encounter: Payer: Self-pay | Admitting: Physician Assistant

## 2019-03-07 ENCOUNTER — Other Ambulatory Visit: Payer: Self-pay

## 2019-03-11 ENCOUNTER — Ambulatory Visit: Payer: 59 | Admitting: Endocrinology

## 2019-03-18 ENCOUNTER — Other Ambulatory Visit: Payer: Self-pay | Admitting: Physician Assistant

## 2019-03-18 DIAGNOSIS — L700 Acne vulgaris: Secondary | ICD-10-CM

## 2019-03-18 DIAGNOSIS — L680 Hirsutism: Secondary | ICD-10-CM

## 2019-03-18 DIAGNOSIS — E282 Polycystic ovarian syndrome: Secondary | ICD-10-CM

## 2019-03-18 NOTE — Telephone Encounter (Signed)
Are you ok with filling the doxy?

## 2019-03-22 ENCOUNTER — Ambulatory Visit: Payer: Self-pay | Admitting: Endocrinology

## 2019-03-27 ENCOUNTER — Other Ambulatory Visit: Payer: Self-pay

## 2019-03-29 ENCOUNTER — Other Ambulatory Visit: Payer: Self-pay

## 2019-03-29 ENCOUNTER — Encounter: Payer: Self-pay | Admitting: Endocrinology

## 2019-03-29 ENCOUNTER — Telehealth: Payer: Self-pay

## 2019-03-29 ENCOUNTER — Ambulatory Visit (INDEPENDENT_AMBULATORY_CARE_PROVIDER_SITE_OTHER): Payer: BLUE CROSS/BLUE SHIELD | Admitting: Endocrinology

## 2019-03-29 VITALS — BP 114/70 | HR 113 | Ht 64.0 in | Wt 207.8 lb

## 2019-03-29 DIAGNOSIS — E669 Obesity, unspecified: Secondary | ICD-10-CM | POA: Diagnosis not present

## 2019-03-29 DIAGNOSIS — E282 Polycystic ovarian syndrome: Secondary | ICD-10-CM

## 2019-03-29 DIAGNOSIS — E039 Hypothyroidism, unspecified: Secondary | ICD-10-CM | POA: Diagnosis not present

## 2019-03-29 LAB — LUTEINIZING HORMONE: LH: 4.95 m[IU]/mL

## 2019-03-29 LAB — BASIC METABOLIC PANEL
BUN: 10 mg/dL (ref 6–23)
CO2: 26 mEq/L (ref 19–32)
Calcium: 9.3 mg/dL (ref 8.4–10.5)
Chloride: 105 mEq/L (ref 96–112)
Creatinine, Ser: 0.6 mg/dL (ref 0.40–1.20)
GFR: 117.08 mL/min (ref >60.00)
Glucose, Bld: 93 mg/dL (ref 70–99)
Potassium: 4.1 mEq/L (ref 3.5–5.1)
Sodium: 139 mEq/L (ref 135–145)

## 2019-03-29 LAB — TSH: TSH: 0.85 u[IU]/mL (ref 0.35–4.50)

## 2019-03-29 LAB — T4, FREE: Free T4: 0.87 ng/dL (ref 0.60–1.60)

## 2019-03-29 LAB — FOLLICLE STIMULATING HORMONE: FSH: 2.8 m[IU]/mL

## 2019-03-29 MED ORDER — SPIRONOLACTONE 100 MG PO TABS: 100.0000 mg | ORAL_TABLET | Freq: Every day | ORAL | 3 refills | Status: DC

## 2019-03-29 MED ORDER — OZEMPIC (1 MG/DOSE) 2 MG/1.5ML ~~LOC~~ SOPN: 1.0000 mg | PEN_INJECTOR | SUBCUTANEOUS | 3 refills | Status: DC

## 2019-03-29 MED ORDER — EFLORNITHINE HCL 13.9 % EX CREA: 45.0000 g | TOPICAL_CREAM | Freq: Two times a day (BID) | CUTANEOUS | 11 refills | Status: DC

## 2019-03-29 NOTE — Telephone Encounter (Signed)
Your request has been denied CaseId:[id number];Status:Denied;Review Type:Prior Auth;Appeal Information: Attention:ATTN: Kulm [id number]. JGGEZ,[place],[id number] KPTWS:[phone] YFV:[phone]; Important - Please read the below note on eAppeals: Please reference the denial letter for information on the rights for an appeal, rationale for the denial, and how to submit an appeal including if any information is needed to support the appeal. Note about urgent situations - Generally, an urgent situation is one which, in the opinion of the provider, the health of the patient may be in serious jeopardy or may experience pain that cannot be adequately controlled while waiting for a decision on the appeal.;

## 2019-03-29 NOTE — Telephone Encounter (Signed)
please contact patient: Please continue the lower dosage.

## 2019-03-29 NOTE — Telephone Encounter (Signed)
PA initiated today through Cover My Meds for Ozempic. Will await insurance response re: approval/denial.  Alexiana Laverdure (Key: [id number])  [employer] is reviewing your PA request and will respond within 24 hours for Medicaid or up to 72 hours for non-Medicaid plans, based on the required timeframe determined by state or federal regulations. To check for an update later, open this request from your dashboard.

## 2019-03-29 NOTE — Patient Instructions (Addendum)
It is important to know that several of your medications are not safe during pregnancy, so if a pregnancy is possible, you should not take them.   Please see a weight loss specialist.  you will receive a phone call, about a day and time for an appointment.   I have sent 3 prescriptions to your pharmacy, for a skin cream, to apply for the hair growth, to increase the Ozempic, and to increase the spironolactone. Blood tests are requested for you today.  We'll let you know about the results.  Based on the results, you should probably consider taking birth control pills for the hair growth, also.    Bariatric Surgery You have so much to gain by losing weight.  You may have already tried every diet and exercise plan imaginable.  And, you may have sought advice from your family physician, too.   Sometimes, in spite of such diligent efforts, you may not be able to achieve long-term results by yourself.  In cases of severe obesity, bariatric or weight loss surgery is a proven method of achieving long-term weight control.  Our Services Our bariatric surgery programs offer our patients new hope and long-term weight-loss solution.  Since introducing our services in 2003, we have conducted more than 2,400 successful procedures.  Our program is designated as a Investment banker, corporateComprehensive Center by the Metabolic and Bariatric Surgery Accreditation and Quality Improvement Program (MBSAQIP), a Child psychotherapistnational accrediting body that sets rigorous patient safety and outcome standards.  Our program is also designated as a Engineer, manufacturing systemsCenter of Excellence by [employer] insurance companies.   Our exceptional weight-loss surgery team specializes in diagnosis, treatment, follow-up care, and ongoing support for our patients with severe weight loss challenges.  We currently offer laparoscopic sleeve gastrectomy, gastric bypass, and adjustable gastric band (LAP-BAND).    Attend our Bariatrics Seminar Choosing to undergo a bariatric procedure is a big decision, and  one that should not be taken lightly.  You now have two options in how you learn about weight-loss surgery - in person or online.  Our objective is to ensure you have all of the information that you need to evaluate the advantages and obligations of this life changing procedure.  Please note that you are not alone in this process, and our experienced team is ready to assist and answer all of your questions.  There are several ways to register for a seminar (either on-line or in person): 1)  Call [phone] 2) Go on-line to [facility] and register for either type of seminar.  [email or web address]      Oral Contraception Use Oral contraceptive pills (OCPs) are medicines that you take to prevent pregnancy. OCPs work by:  Preventing the ovaries from releasing eggs.  Thickening mucus in the lower part of the uterus (cervix), which prevents sperm from entering the uterus.  Thinning the lining of the uterus (endometrium), which prevents a fertilized egg from attaching to the endometrium. OCPs are highly effective when taken exactly as prescribed. However, OCPs do not prevent sexually transmitted infections (STIs). Safe sex practices, such as using condoms while on an OCP, can help prevent STIs. Before taking OCPs, you may have a physical exam, blood test, and Pap test. A Pap test involves taking a sample of cells from your cervix to check for cancer. Discuss with your health care provider the possible side effects of the OCP you may be prescribed. When you start an OCP, be aware that it can take 2-3 months for your body to adjust to  changes in hormone levels. How to take oral contraceptive pills Follow instructions from your health care provider about how to start taking your first cycle of OCPs. Your health care provider may recommend that you:  Start the pill on day 1 of your menstrual period. If you start at this time, you will not need any backup form of birth control  (contraception), such as condoms.  Start the pill on the first Sunday after your menstrual period or on the day you get your prescription. In these cases, you will need to use backup contraception for the first week.  Start the pill at any time of your cycle. ? If you take the pill within 5 days of the start of your period, you will not need a backup form of contraception. ? If you start at any other time of your menstrual cycle, you will need to use another form of contraception for 7 days. If your OCP is the type called a minipill, it will protect you from pregnancy after taking it for 2 days (48 hours), and you can stop using backup contraception after that time. After you have started taking OCPs:  If you forget to take 1 pill, take it as soon as you remember. Take the next pill at the regular time.  If you miss 2 or more pills, call your health care provider. Different pills have different instructions for missed doses. Use backup birth control until your next menstrual period starts.  If you use a 28-day pack that contains inactive pills and you miss 1 of the last 7 pills (pills with no hormones), throw away the rest of the non-hormone pills and start a new pill pack. No matter which day you start the OCP, you will always start a new pack on that same day of the week. Have an extra pack of OCPs and a backup contraceptive method available in case you miss some pills or lose your OCP pack. Follow these instructions at home:  Do not use any products that contain nicotine or tobacco, such as cigarettes and e-cigarettes. If you need help quitting, ask your health care provider.  Always use a condom to protect against STIs. OCPs do not protect against STIs.  Use a calendar to mark the days of your menstrual period.  Read the information and directions that came with your OCP. Talk to your health care provider if you have questions. Contact a health care provider if:  You develop nausea and  vomiting.  You have abnormal vaginal discharge or bleeding.  You develop a rash.  You miss your menstrual period. Depending on the type of OCP you are taking, this may be a sign of pregnancy. Ask your health care provider for more information.  You are losing your hair.  You need treatment for mood swings or depression.  You get dizzy when taking the OCP.  You develop acne after taking the OCP.  You become pregnant or think you may be pregnant.  You have diarrhea, constipation, and abdominal pain or cramps.  You miss 2 or more pills. Get help right away if:  You develop chest pain.  You develop shortness of breath.  You have an uncontrolled or severe headache.  You develop numbness or slurred speech.  You develop visual or speech problems.  You develop pain, redness, and swelling in your legs.  You develop weakness or numbness in your arms or legs. Summary  Oral contraceptive pills (OCPs) are medicines that you take to prevent  pregnancy.  OCPs do not prevent sexually transmitted infections (STIs). Always use a condom to protect against STIs.  When you start an OCP, be aware that it can take 2-3 months for your body to adjust to changes in hormone levels.  Read all the information and directions that come with your OCP. This information is not intended to replace advice given to you by your health care provider. Make sure you discuss any questions you have with your health care provider. Document Released: 06/23/2011 Document Revised: 10/26/2018 Document Reviewed: 08/15/2016 Elsevier Patient Education  2020 [employer].  Oral Contraception Information Oral contraceptive pills (OCPs) are medicines taken to prevent pregnancy. OCPs are taken by mouth, and they work by:  Preventing the ovaries from releasing eggs.  Thickening mucus in the lower part of the uterus (cervix), which prevents sperm from entering the uterus.  Thinning the lining of the uterus  (endometrium), which prevents a fertilized egg from attaching to the endometrium. OCPs are highly effective when taken exactly as prescribed. However, OCPs do not prevent STIs (sexually transmitted infections). Safe sex practices, such as using condoms while on an OCP, can help prevent STIs. Before starting OCPs Before you start taking OCPs, you may have a physical exam, blood test, and Pap test. However, you are not required to have a pelvic exam in order to be prescribed OCPs. Your health care provider will make sure you are a good candidate for oral contraception. OCPs are not a good option for certain women, including women who smoke and are older than 35 years, and women with a medical history of high blood pressure, deep vein thrombosis, pulmonary embolism, stroke, cardiovascular disease, or peripheral vascular disease. Discuss with your health care provider the possible side effects of the OCP you may be prescribed. When you start an OCP, be aware that it can take 2-3 months for your body to adjust to changes in hormone levels. Follow instructions from your health care provider about how to start taking your first cycle of OCPs. Depending on when you start the pill, you may need to use a backup form of birth control, such as condoms, during the first week. Make sure you know what steps to take if you ever forget to take the pill. Types of oral contraception  The most common types of birth control pills contain the hormones estrogen and progestin (synthetic progesterone) or progestin only. The combination pill This type of pill contains estrogen and progestin hormones. Combination pills often come in packs of 21, 28, or 91 pills. For each pack, the last 7 pills may not contain hormones, which means you may stop taking the pills for 7 days. Menstrual bleeding occurs during the week that you do not take the pills or that you take the pills with no hormones in them. The minipill This type of pill  contains the progestin hormone only. It comes in packs of 28 pills. All 28 pills contain the hormone. You take the pill every day. It is very important to take the pill at the same time each day. Advantages of oral contraceptive pills  Provides reliable and continuous contraception if taken as instructed.  May treat or decrease symptoms of: ? Menstrual period cramps. ? Irregular menstrual cycle or bleeding. ? Heavy menstrual flow. ? Abnormal uterine bleeding. ? Acne, depending on the type of pill. ? Polycystic ovarian syndrome. ? Endometriosis. ? Iron deficiency anemia. ? Premenstrual symptoms, including premenstrual dysphoric disorder.  May reduce the risk of endometrial and  ovarian cancer.  Can be used as emergency contraception.  Prevents mislocated (ectopic) pregnancies and infections of the fallopian tubes. Things that can make oral contraceptive pills less effective OCPs can be less effective if:  You forget to take the pill at the same time every day. This is especially important when taking the minipill.  You have a stomach or intestinal disease that reduces your body's ability to absorb the pill.  You take OCPs with other medicines that make OCPs less effective, such as antibiotics, certain HIV medicines, and some seizure medicines.  You take expired OCPs.  You forget to restart the pill on day 7, if using the packs of 21 pills. Risks associated with oral contraceptive pills Oral contraceptive pills can sometimes cause side effects, such as:  Headache.  Depression.  Trouble sleeping.  Nausea and vomiting.  Breast tenderness.  Irregular bleeding or spotting during the first several months.  Bloating or fluid retention.  Increase in blood pressure. Combination pills are also associated with a small increase in the risk of:  Blood clots.  Heart attack.  Stroke. Summary  Oral contraceptive pills are medicines taken by mouth to prevent pregnancy. They  are highly effective when taken exactly as prescribed.  The most common types of birth control pills contain the hormones estrogen and progestin (synthetic progesterone) or progestin only.  Before you start taking the pill, you may have a physical exam, blood test, and Pap test. Your health care provider will make sure you are a good candidate for oral contraception.  The combination pill may come in a 21-day pack, a 28-day pack, or a 91-day pack. The minipill contains the progesterone hormone only and comes in packs of 28 pills.  Oral contraceptive pills can sometimes cause side effects, such as headache, nausea, breast tenderness, or irregular bleeding. This information is not intended to replace advice given to you by your health care provider. Make sure you discuss any questions you have with your health care provider. Document Released: 09/24/2002 Document Revised: 06/16/2017 Document Reviewed: 09/27/2016 Elsevier Patient Education  2020 [employer].

## 2019-03-29 NOTE — Progress Notes (Signed)
Subjective:    Patient ID: Veronica Gay, female    DOB: October 27, 1990, 28 y.o.   MRN: [id number]  HPI Pt is referred by Nelson Chimes, PA, for polycystic ovary syndrome.  Pt had menarche at age 84.  She has always had regular menses. She is G2P2.  She reports 5 years of moderate hair on the face, and assoc acne.  She does not want a pregnancy.  She has never had ovarian imaging.  She took oral contraceptives x 6 mos, 9 years ago.  She then used mirena device x 2 years.  She has been on aldactone x 5 mos, and Ozempic x 6 mos.   Pt reports hypothyroidism was dx'ed in 2016.  she has never been on prescribed thyroid hormone therapy since then.  she has never taken kelp or any other type of non-prescribed thyroid product.  she has never had thyroid imaging.  she has never had thyroid surgery, or XRT to the neck.  she has never been on amiodarone or lithium.   Past Medical History:  Diagnosis Date  . ANEMIA, IRON DEFICIENCY 04/23/2010   Qualifier: Diagnosis of  By: Madilyn Fireman MD, Barnetta Chapel    . Anxiety   . Complicated migraine    last episode 2012  . Diabetes mellitus without complication ([place])   . Dyslipidemia 12/03/2018  . History of gestational diabetes   . Hypothyroidism     Past Surgical History:  Procedure Laterality Date  . CESAREAN SECTION    . TONSILLECTOMY      Social History   Socioeconomic History  . Marital status: Married    Spouse name: Not on file  . Number of children: Not on file  . Years of education: Not on file  . Highest education level: Not on file  Occupational History  . Not on file  Social Needs  . Financial resource strain: Not on file  . Food insecurity    Worry: Not on file    Inability: Not on file  . Transportation needs    Medical: Not on file    Non-medical: Not on file  Tobacco Use  . Smoking status: Never Smoker  . Smokeless tobacco: Never Used  Substance and Sexual Activity  . Alcohol use: No  . Drug use: No  . Sexual activity: Yes   Birth control/protection: Condom, Other-see comments  Lifestyle  . Physical activity    Days per week: Not on file    Minutes per session: Not on file  . Stress: Not on file  Relationships  . Social Herbalist on phone: Not on file    Gets together: Not on file    Attends religious service: Not on file    Active member of club or organization: Not on file    Attends meetings of clubs or organizations: Not on file    Relationship status: Not on file  . Intimate partner violence    Fear of current or ex partner: Not on file    Emotionally abused: Not on file    Physically abused: Not on file    Forced sexual activity: Not on file  Other Topics Concern  . Not on file  Social History Narrative  . Not on file    Current Outpatient Medications on File Prior to Visit  Medication Sig Dispense Refill  . doxycycline (VIBRA-TABS) 100 MG tablet Take 1 tablet (100 mg total) by mouth daily. 90 tablet 0  . DULoxetine (CYMBALTA) 60 MG capsule  Take 60 mg by mouth 2 (two) times daily.    . Ergocalciferol 2000 units CAPS Take 2,000 Units by mouth daily.    . ferrous sulfate 325 (65 FE) MG EC tablet Take 1 tab PO 1-2 times daily with a meal every other day 90 tablet 11  . levothyroxine (SYNTHROID) 50 MCG tablet Take 1 tablet (50 mcg total) by mouth daily before breakfast. Due for labs 90 tablet 1   No current facility-administered medications on file prior to visit.     Allergies  Allergen Reactions  . Cephalosporins     Family History  Problem Relation Age of Onset  . Lung cancer Other        grandfather  . Depression Other        mom,sister  . Diabetes Other        grandmother,grandfather  . Hypertension Other        father  . Thyroid disease Other        father  . Diabetes Mother   . Anxiety disorder Mother   . Diabetes Father   . Fibromyalgia Father   . Multiple myeloma Father   . Breast cancer Maternal Aunt   . Breast cancer Maternal Grandmother   . Lung cancer  Maternal Grandfather   . Diabetes Paternal Grandfather   . Anxiety disorder Sister     BP 114/70 (BP Location: Left Arm, Patient Position: Sitting, Cuff Size: Large)   Pulse (!) 113   Ht 5' 4" (1.626 m)   Wt 207 lb 12.8 oz (94.3 kg)   SpO2 98%   BMI 35.67 kg/m    Review of Systems Denies galactorrhea, sob, diplopia, edema, cold intolerance, numbness, voice change, easy bruising, nausea, and dry skin.  she reports difficulty losing weight.  She has brittle fingernails, fatigue, monthly pelvic cramps, diaphoresis, hair loss on the head, depression, insomnia, and heavy menses.       Objective:   Physical Exam VS: see vs page GEN: no distress HEAD: head: no deformity eyes: no periorbital swelling, no proptosis external nose and ears are normal NECK: supple, thyroid is not enlarged.  CHEST WALL: no deformity LUNGS: clear to auscultation CV: reg rate and rhythm, no murmur ABD: abdomen is soft, nontender.  no hepatosplenomegaly.  not distended.  no hernia MUSCULOSKELETAL: muscle bulk and strength are grossly normal.  no obvious joint swelling.  gait is normal and steady.   EXTEMITIES: no deformity.  no ulcer on the feet.  feet are of normal color and temp.  no edema.  PULSES: dorsalis pedis intact bilat.  no carotid bruit NEURO:  cn 2-12 grossly intact.   readily moves all 4's.  sensation is intact to touch on the feet.   SKIN:  Normal texture and temperature.  No rash or suspicious lesion is visible.  Acanthosis nigricans and acne, of the axillae.  Moderate terminal hair on the face.   NODES:  None palpable at the neck PSYCH: alert, well-oriented.  Does not appear anxious nor depressed.  Lab Results  Component Value Date   TSH 0.72 06/07/2018   T4TOTAL 8.7 06/07/2018   I have reviewed outside records, and summarized: Pt was noted to have PCO, and referred here.  She was also seen recently for coronavirus dx.     Assessment & Plan:  PCO, new to me Obesity: we discussed rx  options   Patient Instructions  It is important to know that several of your medications are not safe during pregnancy, so   if a pregnancy is possible, you should not take them.   Please see a weight loss specialist.  you will receive a phone call, about a day and time for an appointment.   I have sent 3 prescriptions to your pharmacy, for a skin cream, to apply for the hair growth, to increase the Ozempic, and to increase the spironolactone. Blood tests are requested for you today.  We'll let you know about the results.  Based on the results, you should probably consider taking birth control pills for the hair growth, also.    Bariatric Surgery You have so much to gain by losing weight.  You may have already tried every diet and exercise plan imaginable.  And, you may have sought advice from your family physician, too.   Sometimes, in spite of such diligent efforts, you may not be able to achieve long-term results by yourself.  In cases of severe obesity, bariatric or weight loss surgery is a proven method of achieving long-term weight control.  Our Services Our bariatric surgery programs offer our patients new hope and long-term weight-loss solution.  Since introducing our services in 2003, we have conducted more than 2,400 successful procedures.  Our program is designated as a Comprehensive Center by the Metabolic and Bariatric Surgery Accreditation and Quality Improvement Program (MBSAQIP), a national accrediting body that sets rigorous patient safety and outcome standards.  Our program is also designated as a Center of Excellence by major insurance companies.   Our exceptional weight-loss surgery team specializes in diagnosis, treatment, follow-up care, and ongoing support for our patients with severe weight loss challenges.  We currently offer laparoscopic sleeve gastrectomy, gastric bypass, and adjustable gastric band (LAP-BAND).    Attend our Bariatrics Seminar Choosing to undergo a  bariatric procedure is a big decision, and one that should not be taken lightly.  You now have two options in how you learn about weight-loss surgery - in person or online.  Our objective is to ensure you have all of the information that you need to evaluate the advantages and obligations of this life changing procedure.  Please note that you are not alone in this process, and our experienced team is ready to assist and answer all of your questions.  There are several ways to register for a seminar (either on-line or in person): 1)  Call [phone] 2) Go on-line to  and register for either type of seminar.  [email or web address]      Oral Contraception Use Oral contraceptive pills (OCPs) are medicines that you take to prevent pregnancy. OCPs work by:  Preventing the ovaries from releasing eggs.  Thickening mucus in the lower part of the uterus (cervix), which prevents sperm from entering the uterus.  Thinning the lining of the uterus (endometrium), which prevents a fertilized egg from attaching to the endometrium. OCPs are highly effective when taken exactly as prescribed. However, OCPs do not prevent sexually transmitted infections (STIs). Safe sex practices, such as using condoms while on an OCP, can help prevent STIs. Before taking OCPs, you may have a physical exam, blood test, and Pap test. A Pap test involves taking a sample of cells from your cervix to check for cancer. Discuss with your health care provider the possible side effects of the OCP you may be prescribed. When you start an OCP, be aware that it can take 2-3 months for your body to adjust to changes in hormone levels. How to take oral contraceptive pills Follow instructions from your health care   provider about how to start taking your first cycle of OCPs. Your health care provider may recommend that you:  Start the pill on day 1 of your menstrual period. If you start at this time, you will  not need any backup form of birth control (contraception), such as condoms.  Start the pill on the first Sunday after your menstrual period or on the day you get your prescription. In these cases, you will need to use backup contraception for the first week.  Start the pill at any time of your cycle. ? If you take the pill within 5 days of the start of your period, you will not need a backup form of contraception. ? If you start at any other time of your menstrual cycle, you will need to use another form of contraception for 7 days. If your OCP is the type called a minipill, it will protect you from pregnancy after taking it for 2 days (48 hours), and you can stop using backup contraception after that time. After you have started taking OCPs:  If you forget to take 1 pill, take it as soon as you remember. Take the next pill at the regular time.  If you miss 2 or more pills, call your health care provider. Different pills have different instructions for missed doses. Use backup birth control until your next menstrual period starts.  If you use a 28-day pack that contains inactive pills and you miss 1 of the last 7 pills (pills with no hormones), throw away the rest of the non-hormone pills and start a new pill pack. No matter which day you start the OCP, you will always start a new pack on that same day of the week. Have an extra pack of OCPs and a backup contraceptive method available in case you miss some pills or lose your OCP pack. Follow these instructions at home:  Do not use any products that contain nicotine or tobacco, such as cigarettes and e-cigarettes. If you need help quitting, ask your health care provider.  Always use a condom to protect against STIs. OCPs do not protect against STIs.  Use a calendar to mark the days of your menstrual period.  Read the information and directions that came with your OCP. Talk to your health care provider if you have questions. Contact a health  care provider if:  You develop nausea and vomiting.  You have abnormal vaginal discharge or bleeding.  You develop a rash.  You miss your menstrual period. Depending on the type of OCP you are taking, this may be a sign of pregnancy. Ask your health care provider for more information.  You are losing your hair.  You need treatment for mood swings or depression.  You get dizzy when taking the OCP.  You develop acne after taking the OCP.  You become pregnant or think you may be pregnant.  You have diarrhea, constipation, and abdominal pain or cramps.  You miss 2 or more pills. Get help right away if:  You develop chest pain.  You develop shortness of breath.  You have an uncontrolled or severe headache.  You develop numbness or slurred speech.  You develop visual or speech problems.  You develop pain, redness, and swelling in your legs.  You develop weakness or numbness in your arms or legs. Summary  Oral contraceptive pills (OCPs) are medicines that you take to prevent pregnancy.  OCPs do not prevent sexually transmitted infections (STIs). Always use a condom to protect  against STIs.  When you start an OCP, be aware that it can take 2-3 months for your body to adjust to changes in hormone levels.  Read all the information and directions that come with your OCP. This information is not intended to replace advice given to you by your health care provider. Make sure you discuss any questions you have with your health care provider. Document Released: 06/23/2011 Document Revised: 10/26/2018 Document Reviewed: 08/15/2016 Elsevier Patient Education  2020 [employer]  Oral Contraception Information Oral contraceptive pills (OCPs) are medicines taken to prevent pregnancy. OCPs are taken by mouth, and they work by:  Preventing the ovaries from releasing eggs.  Thickening mucus in the lower part of the uterus (cervix), which prevents sperm from entering the uterus.   Thinning the lining of the uterus (endometrium), which prevents a fertilized egg from attaching to the endometrium. OCPs are highly effective when taken exactly as prescribed. However, OCPs do not prevent STIs (sexually transmitted infections). Safe sex practices, such as using condoms while on an OCP, can help prevent STIs. Before starting OCPs Before you start taking OCPs, you may have a physical exam, blood test, and Pap test. However, you are not required to have a pelvic exam in order to be prescribed OCPs. Your health care provider will make sure you are a good candidate for oral contraception. OCPs are not a good option for certain women, including women who smoke and are older than 35 years, and women with a medical history of high blood pressure, deep vein thrombosis, pulmonary embolism, stroke, cardiovascular disease, or peripheral vascular disease. Discuss with your health care provider the possible side effects of the OCP you may be prescribed. When you start an OCP, be aware that it can take 2-3 months for your body to adjust to changes in hormone levels. Follow instructions from your health care provider about how to start taking your first cycle of OCPs. Depending on when you start the pill, you may need to use a backup form of birth control, such as condoms, during the first week. Make sure you know what steps to take if you ever forget to take the pill. Types of oral contraception  The most common types of birth control pills contain the hormones estrogen and progestin (synthetic progesterone) or progestin only. The combination pill This type of pill contains estrogen and progestin hormones. Combination pills often come in packs of 21, 28, or 91 pills. For each pack, the last 7 pills may not contain hormones, which means you may stop taking the pills for 7 days. Menstrual bleeding occurs during the week that you do not take the pills or that you take the pills with no hormones in them.  The minipill This type of pill contains the progestin hormone only. It comes in packs of 28 pills. All 28 pills contain the hormone. You take the pill every day. It is very important to take the pill at the same time each day. Advantages of oral contraceptive pills  Provides reliable and continuous contraception if taken as instructed.  May treat or decrease symptoms of: ? Menstrual period cramps. ? Irregular menstrual cycle or bleeding. ? Heavy menstrual flow. ? Abnormal uterine bleeding. ? Acne, depending on the type of pill. ? Polycystic ovarian syndrome. ? Endometriosis. ? Iron deficiency anemia. ? Premenstrual symptoms, including premenstrual dysphoric disorder.  May reduce the risk of endometrial and ovarian cancer.  Can be used as emergency contraception.  Prevents mislocated (ectopic) pregnancies and infections   of the fallopian tubes. Things that can make oral contraceptive pills less effective OCPs can be less effective if:  You forget to take the pill at the same time every day. This is especially important when taking the minipill.  You have a stomach or intestinal disease that reduces your body's ability to absorb the pill.  You take OCPs with other medicines that make OCPs less effective, such as antibiotics, certain HIV medicines, and some seizure medicines.  You take expired OCPs.  You forget to restart the pill on day 7, if using the packs of 21 pills. Risks associated with oral contraceptive pills Oral contraceptive pills can sometimes cause side effects, such as:  Headache.  Depression.  Trouble sleeping.  Nausea and vomiting.  Breast tenderness.  Irregular bleeding or spotting during the first several months.  Bloating or fluid retention.  Increase in blood pressure. Combination pills are also associated with a small increase in the risk of:  Blood clots.  Heart attack.  Stroke. Summary  Oral contraceptive pills are medicines taken by  mouth to prevent pregnancy. They are highly effective when taken exactly as prescribed.  The most common types of birth control pills contain the hormones estrogen and progestin (synthetic progesterone) or progestin only.  Before you start taking the pill, you may have a physical exam, blood test, and Pap test. Your health care provider will make sure you are a good candidate for oral contraception.  The combination pill may come in a 21-day pack, a 28-day pack, or a 91-day pack. The minipill contains the progesterone hormone only and comes in packs of 28 pills.  Oral contraceptive pills can sometimes cause side effects, such as headache, nausea, breast tenderness, or irregular bleeding. This information is not intended to replace advice given to you by your health care provider. Make sure you discuss any questions you have with your health care provider. Document Released: 09/24/2002 Document Revised: 06/16/2017 Document Reviewed: 09/27/2016 Elsevier Patient Education  2020 [employer].

## 2019-04-01 ENCOUNTER — Encounter: Payer: Self-pay | Admitting: Endocrinology

## 2019-04-01 ENCOUNTER — Telehealth: Payer: Self-pay

## 2019-04-01 LAB — TESTOSTERONE,FREE AND TOTAL
Testosterone, Free: 3 pg/mL (ref 0.0–4.2)
Testosterone: 29 ng/dL (ref 8–48)

## 2019-04-01 LAB — DHEA-SULFATE: DHEA-SO4: 125 ug/dL (ref 18–391)

## 2019-04-01 LAB — PROLACTIN: Prolactin: 5.2 ng/mL

## 2019-04-01 MED ORDER — NORGESTIM-ETH ESTRAD TRIPHASIC 0.18/0.215/0.25 MG-25 MCG PO TABS: 1.0000 | ORAL_TABLET | Freq: Every day | ORAL | 11 refills | Status: DC

## 2019-04-01 NOTE — Addendum Note (Signed)
Addended by: Renato Shin on: 04/01/2019 04:40 PM   Modules accepted: Orders

## 2019-04-01 NOTE — Telephone Encounter (Signed)
Following message routed to Dr. Loanne Drilling:  Visit Follow-Up Question  Cresenciano Lick, CMA routed conversation to You 5 minutes ago (1:04 PM)    Ruthell Rummage, MD 7 minutes ago (1:02 PM)     Please go ahead and call in birth control. I would like one that isn't going to contribute to any further weight gain.  Thank you,  Veronica Gay

## 2019-04-01 NOTE — Telephone Encounter (Signed)
Called pt and informed about denial as well as new orders. Verbalized acceptance and understanding.

## 2019-04-01 NOTE — Telephone Encounter (Signed)
LVM requesting returned call 

## 2019-04-01 NOTE — Telephone Encounter (Signed)
OK, I have sent a prescription to your pharmacy.  

## 2019-04-02 NOTE — Telephone Encounter (Signed)
SECOND ATTEMPT:  Called pt but automated voice states call cannot be completed at this time, please try your call again later.

## 2019-04-02 NOTE — Telephone Encounter (Signed)
Called pt and informed of new orders. Verbalized acceptance and understanding. 

## 2019-05-02 ENCOUNTER — Encounter: Payer: Self-pay | Admitting: Physician Assistant

## 2019-05-02 ENCOUNTER — Encounter: Payer: Self-pay | Admitting: Endocrinology

## 2019-05-02 NOTE — Progress Notes (Unsigned)
Called pt and gave her the # to the weight loss clinic so she can make an appt. This is no longer in our queue as it was sent to their queue a month ago

## 2019-06-03 ENCOUNTER — Other Ambulatory Visit: Payer: Self-pay | Admitting: Endocrinology

## 2019-06-03 ENCOUNTER — Encounter: Payer: Self-pay | Admitting: Endocrinology

## 2019-06-03 ENCOUNTER — Encounter: Payer: Self-pay | Admitting: Physician Assistant

## 2019-06-03 DIAGNOSIS — E039 Hypothyroidism, unspecified: Secondary | ICD-10-CM

## 2019-06-03 DIAGNOSIS — L700 Acne vulgaris: Secondary | ICD-10-CM

## 2019-06-03 MED ORDER — NORGESTIM-ETH ESTRAD TRIPHASIC 0.18/0.215/0.25 MG-25 MCG PO TABS: 1.0000 | ORAL_TABLET | Freq: Every day | ORAL | 3 refills | Status: DC

## 2019-06-03 MED ORDER — SPIRONOLACTONE 100 MG PO TABS: 100.0000 mg | ORAL_TABLET | Freq: Every day | ORAL | 3 refills | Status: DC

## 2019-06-03 MED FILL — SPIRONOLACTONE 100 MG TAB: 100 | 90 days supply | Qty: 90 | Fill #0

## 2019-06-03 MED FILL — NORG-EE 0.18-0.215-0.25/0.0: 0.18/0.215/ | 84 days supply | Qty: 84 | Fill #0

## 2019-06-03 NOTE — Telephone Encounter (Signed)
Please advise 

## 2019-06-04 MED ORDER — LEVOTHYROXINE SODIUM 50 MCG PO TABS: 50.0000 ug | ORAL_TABLET | Freq: Every day | ORAL | 1 refills | Status: DC

## 2019-06-04 MED ORDER — DOXYCYCLINE HYCLATE 100 MG PO TABS: 100.0000 mg | ORAL_TABLET | Freq: Every day | ORAL | 1 refills | Status: DC

## 2019-06-04 MED ORDER — DULOXETINE HCL 60 MG PO CPEP: 60.0000 mg | ORAL_CAPSULE | Freq: Two times a day (BID) | ORAL | 1 refills | Status: DC

## 2019-06-04 MED FILL — LEVOTHYROXINE 50 MCG TABLET: 50 | 90 days supply | Qty: 90 | Fill #0

## 2019-06-04 MED FILL — DOXYCYCLINE HYCLATE 100 MG: 100 | 90 days supply | Qty: 90 | Fill #0

## 2019-06-04 MED FILL — DULoxetine HCL 60 MG CPEP: 60 | 90 days supply | Qty: 180 | Fill #0

## 2019-06-07 ENCOUNTER — Ambulatory Visit: Payer: BLUE CROSS/BLUE SHIELD | Admitting: Osteopathic Medicine

## 2019-06-12 ENCOUNTER — Other Ambulatory Visit: Payer: Self-pay | Admitting: Endocrinology

## 2019-06-12 ENCOUNTER — Encounter: Payer: Self-pay | Admitting: Endocrinology

## 2019-06-12 MED ORDER — OZEMPIC (1 MG/DOSE) 2 MG/1.5ML ~~LOC~~ SOPN: 1.0000 mg | PEN_INJECTOR | SUBCUTANEOUS | 3 refills | Status: DC

## 2019-06-24 ENCOUNTER — Encounter: Payer: Self-pay | Admitting: Osteopathic Medicine

## 2019-06-25 ENCOUNTER — Other Ambulatory Visit: Payer: Self-pay

## 2019-06-25 ENCOUNTER — Ambulatory Visit (INDEPENDENT_AMBULATORY_CARE_PROVIDER_SITE_OTHER): Payer: No Typology Code available for payment source | Admitting: Osteopathic Medicine

## 2019-06-25 ENCOUNTER — Encounter: Payer: Self-pay | Admitting: Osteopathic Medicine

## 2019-06-25 VITALS — BP 111/74 | HR 83 | Temp 98.3°F | Wt 210.1 lb

## 2019-06-25 DIAGNOSIS — E063 Autoimmune thyroiditis: Secondary | ICD-10-CM

## 2019-06-25 DIAGNOSIS — L680 Hirsutism: Secondary | ICD-10-CM | POA: Diagnosis not present

## 2019-06-25 DIAGNOSIS — E038 Other specified hypothyroidism: Secondary | ICD-10-CM

## 2019-06-25 DIAGNOSIS — M9901 Segmental and somatic dysfunction of cervical region: Secondary | ICD-10-CM

## 2019-06-25 DIAGNOSIS — R635 Abnormal weight gain: Secondary | ICD-10-CM | POA: Diagnosis not present

## 2019-06-25 DIAGNOSIS — E785 Hyperlipidemia, unspecified: Secondary | ICD-10-CM

## 2019-06-25 DIAGNOSIS — E282 Polycystic ovarian syndrome: Secondary | ICD-10-CM | POA: Diagnosis not present

## 2019-06-25 MED ORDER — PHENTERMINE HCL 37.5 MG PO TABS: 37.5000 mg | ORAL_TABLET | Freq: Every day | ORAL | 0 refills | Status: DC

## 2019-06-25 MED FILL — PHENTERMINE 37.5 MG TABLET: 37.5 | 30 days supply | Qty: 30 | Fill #0

## 2019-06-25 NOTE — Progress Notes (Signed)
HPI: Veronica Gay is a 28 y.o. female who  has a past medical history of ANEMIA, IRON DEFICIENCY (56/10/3327), Anxiety, Complicated migraine, Diabetes mellitus without complication ([place]), Dyslipidemia (12/03/2018), History of gestational diabetes, and Hypothyroidism.  she presents to [facility] today, 06/25/19,  for chief complaint of:  Weight gain / difficulty with weight loss  Reports restricting calories and working out regularly  Has been diligent about portion control and exercise for about 8 months now Exercise has involved interval training, weightlifting, cardio Fatigue and bloating  Ozempic - no changes in weight  Questions re: phentermine?   Also having some lower neck/upper back pain, feels like muscle is tight/bulging disc?  No numbness/weakness down into her arms.     At today's visit 06/25/19 ... PMH, PSH, FH reviewed and updated as needed.  Current medication list and allergy/intolerance hx reviewed and updated as needed. (See remainder of HPI, ROS, Phys Exam below)   No results found.  No results found for this or any previous visit (from the past 72 hour(s)).        ASSESSMENT/PLAN: The primary encounter diagnosis was Abnormal weight gain. Diagnoses of PCOS (polycystic ovarian syndrome), Hypothyroidism due to Hashimoto's thyroiditis, Hirsutism, and Dyslipidemia were also pertinent to this visit.   Advised patient we could certainly try phentermine for 2 to 3 months and see if this might jumpstart things a bit, would consider Qsymia long-term medication, possibly Contrave.  Her hormonal issues, particularly hypothyroidism and PCOS, are not really doing any favors for her in terms of weight loss.  We know that weight gain/difficulty with weight loss is more complicated than calories in versus calories out, but we do not have a lot of actionable advice other than portion control/activity increase, or other helpful medical  interventions at least not now.  I think bariatric surgery could certainly be an option, patient is reluctant to pursue this until everything else has been exhausted and I would tend to agree  OMT applied to lower C-spine/upper T-spine, myofascial release and FPR  No orders of the defined types were placed in this encounter.    Meds ordered this encounter  Medications  . phentermine (ADIPEX-P) 37.5 MG tablet    Sig: Take 1 tablet (37.5 mg total) by mouth daily before breakfast.    Dispense:  30 tablet    Refill:  0    Patient Instructions      Follow-up plan: Return in 4 weeks (on 07/23/2019) for virtual weight check on phentermine - see me sooner if needed! .                                                 ################################################# ################################################# ################################################# #################################################    Current Meds  Medication Sig  . doxycycline (VIBRA-TABS) 100 MG tablet Take 1 tablet (100 mg total) by mouth daily.  . DULoxetine (CYMBALTA) 60 MG capsule Take 1 capsule (60 mg total) by mouth 2 (two) times daily.  . ferrous sulfate 325 (65 FE) MG tablet Take 325 mg by mouth daily.  Marland Kitchen levothyroxine (SYNTHROID) 50 MCG tablet Take 1 tablet (50 mcg total) by mouth daily before breakfast. Due for labs  . Norgestimate-Ethinyl Estradiol Triphasic 0.18/0.215/0.25 MG-25 MCG tab Take 1 tablet by mouth daily.  . Semaglutide, 1 MG/DOSE, (OZEMPIC, 1 MG/DOSE,) 2 MG/1.5ML SOPN Inject 1 mg  into the skin once a week.  . spironolactone (ALDACTONE) 100 MG tablet Take 1 tablet (100 mg total) by mouth daily.  Marland Kitchen VITAMIN D PO Take 1 capsule by mouth daily.  Marland Kitchen ZINC-VITAMIN C PO Take 1 tablet by mouth daily.    Allergies  Allergen Reactions  . Cephalosporins        Review of Systems:  Constitutional: No recent illness  HEENT: No  headache,  no vision change  Cardiac: No  chest pain, No  pressure, No palpitations  Respiratory:  No  shortness of breath. No  Cough  Gastrointestinal: No  abdominal pain, no change on bowel habits  Musculoskeletal: +new myalgia/arthralgia  Skin: No  Rash  Hem/Onc: No  easy bruising/bleeding, No  abnormal lumps/bumps  Neurologic: No  weakness, No  Dizziness  Psychiatric: No  concerns with depression, No  concerns with anxiety  Exam:  BP 111/74 (BP Location: Left Arm, Patient Position: Sitting, Cuff Size: Large)   Pulse 83   Temp 98.3 F (36.8 C) (Oral)   Wt 210 lb 1.3 oz (95.3 kg)   LMP 06/24/2019   BMI 36.06 kg/m   Constitutional: VS see above. General Appearance: alert, well-developed, well-nourished, NAD  Eyes: Normal lids and conjunctive, non-icteric sclera  Ears, Nose, Mouth, Throat: MMM, Normal external inspection ears/nares/mouth/lips/gums.  Neck: No masses, trachea midline.   Respiratory: Normal respiratory effort. no wheeze, no rhonchi, no rales  Cardiovascular: S1/S2 normal, no murmur, no rub/gallop auscultated. RRR.   Musculoskeletal: Gait normal. Symmetric and independent movement of all extremities.  Tender point on right see 7/T1 area, Tart changes along paraspinal musculature along cervical spine  Neurological: Normal balance/coordination. No tremor.  Skin: warm, dry, intact.   Psychiatric: Normal judgment/insight. Normal mood and affect. Oriented x3.       Visit summary with medication list and pertinent instructions was printed for patient to review, patient was advised to alert [place] if any updates are needed. All questions at time of visit were answered - patient instructed to contact office with any additional concerns. ER/RTC precautions were reviewed with the patient and understanding verbalized.   Note: Total time spent 25 minutes, greater than 50% of the visit was spent face-to-face counseling and coordinating care for the following: The primary  encounter diagnosis was Abnormal weight gain. Diagnoses of PCOS (polycystic ovarian syndrome), Hypothyroidism due to Hashimoto's thyroiditis, Hirsutism, and Dyslipidemia were also pertinent to this visit.Marland Kitchen  Please note: voice recognition software was used to produce this document, and typos may escape review. Please contact Dr. Lyn Hollingshead for any needed clarifications.    Follow up plan: Return in 4 weeks (on 07/23/2019) for virtual weight check on phentermine - see me sooner if needed! Marland Kitchen

## 2019-06-26 MED FILL — SPIRONOLACTONE 100 MG TAB: 100 | 90 days supply | Qty: 90 | Fill #0

## 2019-07-01 ENCOUNTER — Encounter: Payer: Self-pay | Admitting: Osteopathic Medicine

## 2019-07-01 DIAGNOSIS — M549 Dorsalgia, unspecified: Secondary | ICD-10-CM

## 2019-07-30 ENCOUNTER — Ambulatory Visit (INDEPENDENT_AMBULATORY_CARE_PROVIDER_SITE_OTHER): Payer: No Typology Code available for payment source | Admitting: Osteopathic Medicine

## 2019-07-30 ENCOUNTER — Encounter: Payer: Self-pay | Admitting: Osteopathic Medicine

## 2019-07-30 VITALS — Temp 98.0°F | Wt 207.0 lb

## 2019-07-30 DIAGNOSIS — E282 Polycystic ovarian syndrome: Secondary | ICD-10-CM

## 2019-07-30 DIAGNOSIS — E785 Hyperlipidemia, unspecified: Secondary | ICD-10-CM

## 2019-07-30 DIAGNOSIS — Z6835 Body mass index (BMI) 35.0-35.9, adult: Secondary | ICD-10-CM

## 2019-07-30 DIAGNOSIS — R635 Abnormal weight gain: Secondary | ICD-10-CM

## 2019-07-30 DIAGNOSIS — E039 Hypothyroidism, unspecified: Secondary | ICD-10-CM

## 2019-07-30 MED ORDER — PHENTERMINE HCL 37.5 MG PO TABS: 37.5000 mg | ORAL_TABLET | Freq: Every day | ORAL | 0 refills | Status: DC

## 2019-07-30 MED FILL — PHENTERMINE 37.5 MG TABLET: 37.5 | 30 days supply | Qty: 30 | Fill #0

## 2019-07-30 NOTE — Progress Notes (Signed)
Virtual Visit via Video (App used: doximity) Note  I connected with      Veronica Gay on 07/30/19 at 8:37 AM  by a telemedicine application and verified that I am speaking with the correct person using two identifiers.  Patient is at home I am in office   I discussed the limitations of evaluation and management by telemedicine and the availability of in person appointments. The patient expressed understanding and agreed to proceed.  History of Present Illness: Veronica Gay is a 29 y.o. female who would like to discuss weight    06/25/19 "Reports restricting calories and working out regularly  Has been diligent about portion control and exercise for about 8 months now Exercise has involved interval training, weightlifting, cardio Fatigue and bloating  Ozempic - no changes in weight  Questions re: phentermine?  A/P:  Advised patient we could certainly try phentermine for 2 to 3 months and see if this might jumpstart things a bit, would consider Qsymia long-term medication, possibly Contrave.  Her hormonal issues, particularly hypothyroidism and PCOS, are not really doing any favors for her in terms of weight loss.  We know that weight gain/difficulty with weight loss is more complicated than calories in versus calories out, but we do not have a lot of actionable advice other than portion control/activity increase, or other helpful medical interventions at least not now.  I think bariatric surgery could certainly be an option, patient is reluctant to pursue this until everything else has been exhausted and I would tend to agree"   Today 07/30/19  Some significant stress/anxiety with family issues, her sister had to deliver her baby early due to some heart failure/preeclampsia issues, baby is in the NICU for the next month or 2.  One of her close friends is very sick with Covid.  Dad just got laid off.  To the past 2 weeks she has noticed more stressed eating then she would like   Wt  Readings from Last 3 Encounters:  07/30/19 207 lb (93.9 kg) - 3 lb weight loss - significant anxiety & family stress, discussed healthier eating options and coping mechanisms, meal timing. Phentermine was refilled for a month   06/25/19 210 lb 1.3 oz (95.3 kg) - started phentermine   03/29/19 207 lb 12.8 oz (94.3 kg)     GAD 7 : Generalized Anxiety Score 07/30/2019 06/25/2019 04/04/2018 02/21/2018  Nervous, Anxious, on Edge 1 0 1 2  Control/stop worrying 3 0 0 2  Worry too much - different things 3 1 0 2  Trouble relaxing _0 Restless 0 0 1 2  Easily annoyed or irritable 0 0 2 1  Afraid - awful might happen 0 0 0 0  Total GAD 7 Score _1 Anxiety Difficulty Somewhat difficult Not difficult at all - -      Observations/Objective: Temp 98 F (36.7 C) (Oral)   Wt 207 lb (93.9 kg)   BMI 35.53 kg/m  BP Readings from Last 3 Encounters:  06/25/19 111/74  03/29/19 114/70  12/25/18 126/82   Exam: Normal Speech.  NAD  Lab and Radiology Results No results found for this or any previous visit (from the past 72 hour(s)). No results found.     Assessment and Plan: 29 y.o. female with The primary encounter diagnosis was Abnormal weight gain. Diagnoses of PCOS (polycystic ovarian syndrome), Dyslipidemia, and Hypothyroidism, unspecified type were also pertinent to this visit.  Understandable that  weight might not have progressed very much given recent stress issues.  Patient has good insight into the issue of stress eating, we talked about spacing out portion/frequent meals as opposed to 1 meal daily, work on eating healthier foods but if nothing else work on timing of meals/snacks to help keep metabolism at a steady level.  Let me know if any other questions or concerns otherwise let us follow-up again in another month.  PDMP not reviewed this encounter. No orders of the defined types were placed in this encounter.  Meds ordered this encounter  Medications  . phentermine  (ADIPEX-P) 37.5 MG tablet    Sig: Take 1 tablet (37.5 mg total) by mouth daily before breakfast.    Dispense:  30 tablet    Refill:  0   There are no Patient Instructions on file for this visit.    Follow Up Instructions: Return in about 4 weeks (around 08/27/2019) for Virtual visit, weight check on phentermine.    I discussed the assessment and treatment plan with the patient. The patient was provided an opportunity to ask questions and all were answered. The patient agreed with the plan and demonstrated an understanding of the instructions.   The patient was advised to call back or seek an in-person evaluation if any new concerns, if symptoms worsen or if the condition fails to improve as anticipated.  25 minutes of non-face-to-face time was provided during this encounter.      . . . . . . . . . . . . . Marland Kitchen                   Historical information moved to improve visibility of documentation.  Past Medical History:  Diagnosis Date  . ANEMIA, IRON DEFICIENCY 04/23/2010   Qualifier: Diagnosis of  By: Madilyn Fireman MD, Barnetta Chapel    . Anxiety   . Complicated migraine    last episode 2012  . Diabetes mellitus without complication ([place])   . Dyslipidemia 12/03/2018  . History of gestational diabetes   . Hypothyroidism    Past Surgical History:  Procedure Laterality Date  . CESAREAN SECTION    . TONSILLECTOMY     Social History   Tobacco Use  . Smoking status: Never Smoker  . Smokeless tobacco: Never Used  Substance Use Topics  . Alcohol use: No   family history includes Anxiety disorder in her mother and sister; Breast cancer in her maternal aunt and maternal grandmother; Depression in an other family member; Diabetes in her father, mother, paternal grandfather, and another family member; Fibromyalgia in her father; Hypertension in an other family member; Lung cancer in her maternal grandfather and another family member; Multiple myeloma in her father;  Thyroid disease in an other family member.  Medications: Current Outpatient Medications  Medication Sig Dispense Refill  . DULoxetine (CYMBALTA) 60 MG capsule Take 1 capsule (60 mg total) by mouth 2 (two) times daily. 180 capsule 1  . ferrous sulfate 325 (65 FE) MG tablet Take 325 mg by mouth daily.    Marland Kitchen levothyroxine (SYNTHROID) 50 MCG tablet Take 1 tablet (50 mcg total) by mouth daily before breakfast. Due for labs 90 tablet 1  . Norgestimate-Ethinyl Estradiol Triphasic 0.18/0.215/0.25 MG-25 MCG tab Take 1 tablet by mouth daily. 3 Package 3  . phentermine (ADIPEX-P) 37.5 MG tablet Take 1 tablet (37.5 mg total) by mouth daily before breakfast. 30 tablet 0  . spironolactone (ALDACTONE) 100 MG tablet Take 1 tablet (100 mg total) by  mouth daily. 90 tablet 3  . VITAMIN D PO Take 1 capsule by mouth daily.    Marland Kitchen ZINC-VITAMIN C PO Take 1 tablet by mouth daily.     No current facility-administered medications for this visit.   Allergies  Allergen Reactions  . Cephalosporins

## 2019-08-09 ENCOUNTER — Other Ambulatory Visit: Payer: No Typology Code available for payment source

## 2019-08-20 ENCOUNTER — Ambulatory Visit (INDEPENDENT_AMBULATORY_CARE_PROVIDER_SITE_OTHER): Payer: Self-pay | Admitting: Family Medicine

## 2019-08-22 ENCOUNTER — Encounter (INDEPENDENT_AMBULATORY_CARE_PROVIDER_SITE_OTHER): Payer: Self-pay | Admitting: Family Medicine

## 2019-08-22 ENCOUNTER — Other Ambulatory Visit: Payer: Self-pay

## 2019-08-22 ENCOUNTER — Ambulatory Visit (INDEPENDENT_AMBULATORY_CARE_PROVIDER_SITE_OTHER): Payer: No Typology Code available for payment source | Admitting: Family Medicine

## 2019-08-22 VITALS — BP 108/75 | HR 99 | Temp 98.2°F | Ht 64.0 in | Wt 203.0 lb

## 2019-08-22 DIAGNOSIS — Z9189 Other specified personal risk factors, not elsewhere classified: Secondary | ICD-10-CM | POA: Diagnosis not present

## 2019-08-22 DIAGNOSIS — E039 Hypothyroidism, unspecified: Secondary | ICD-10-CM | POA: Diagnosis not present

## 2019-08-22 DIAGNOSIS — Z6834 Body mass index (BMI) 34.0-34.9, adult: Secondary | ICD-10-CM

## 2019-08-22 DIAGNOSIS — R5383 Other fatigue: Secondary | ICD-10-CM

## 2019-08-22 DIAGNOSIS — Z1331 Encounter for screening for depression: Secondary | ICD-10-CM

## 2019-08-22 DIAGNOSIS — E669 Obesity, unspecified: Secondary | ICD-10-CM

## 2019-08-22 DIAGNOSIS — R739 Hyperglycemia, unspecified: Secondary | ICD-10-CM | POA: Diagnosis not present

## 2019-08-22 DIAGNOSIS — Z0289 Encounter for other administrative examinations: Secondary | ICD-10-CM

## 2019-08-22 DIAGNOSIS — R0602 Shortness of breath: Secondary | ICD-10-CM

## 2019-08-22 DIAGNOSIS — D508 Other iron deficiency anemias: Secondary | ICD-10-CM

## 2019-08-22 DIAGNOSIS — E559 Vitamin D deficiency, unspecified: Secondary | ICD-10-CM

## 2019-08-22 NOTE — Progress Notes (Signed)
Chief Complaint:   OBESITY Veronica Gay (MR# [id number]) is a 29 y.o. female who presents for evaluation and treatment of obesity and related comorbidities. Current BMI is Body mass index is 34.84 kg/m. Veronica Gay has been struggling with her weight for many years and has been unsuccessful in either losing weight, maintaining weight loss, or reaching her healthy weight goal.  Veronica Gay has been on phentermine for 1-2 months, and she has lost some weight but she doesn't fell that polyphagia has been her issue. She has cut her calories very low, below 1,000.  Veronica Gay is currently in the action stage of change and ready to dedicate time achieving and maintaining a healthier weight. Veronica Gay is interested in becoming our patient and working on intensive lifestyle modifications including (but not limited to) diet and exercise for weight loss.  Veronica Gay's habits were reviewed today and are as follows: Her family eats meals together, she thinks her family will eat healthier with her, her desired weight loss is 38 lbs, she started gaining weight around 2011, her heaviest weight ever was 215 pounds, she has significant food cravings issues, she skips meals frequently and she struggles with emotional eating.  Depression Screen Veronica Gay's Food and Mood (modified PHQ-9) score was 13.  Depression screen PHQ 2/9 08/22/2019  Decreased Interest 1  Down, Depressed, Hopeless 1  PHQ - 2 Score 2  Altered sleeping 2  Tired, decreased energy 3  Change in appetite 2  Feeling bad or failure about yourself  3  Trouble concentrating 1  Moving slowly or fidgety/restless 0  Suicidal thoughts 0  PHQ-9 Score 13  Difficult doing work/chores Somewhat difficult   Subjective:   1. Other fatigue Veronica Gay admits to daytime somnolence and admits to waking up still tired. Patent has a history of symptoms of daytime fatigue and morning headache. [name] generally gets 6 hours of sleep per night, and states that she has nightime awakenings.  Snoring is present. Apneic episodes are not present. Epworth Sleepiness Score is 10.  2. SOB (shortness of breath) on exertion Veronica Gay notes increasing shortness of breath with exercising and seems to be worsening over time with weight gain. She notes getting out of breath sooner with activity than she used to. This has not gotten worse recently. Veronica Gay denies shortness of breath at rest or orthopnea.  3. Hypothyroidism, unspecified type Veronica Gay is on medications and she has no recent labs. She notes fatigue, but denies palpitations.  4. Hyperglycemia Veronica Gay has a history of gestational diabetes mellitus. She notes abnormally low hunger signals in the first half of the day, consistent with insulin resistance.  5. Vitamin D deficiency Veronica Gay is on OTC Vit D 5,000 IU daily. She notes fatigue.  6. Other iron deficiency anemia Veronica Gay notes fatigue, but denies palpitations. She is due for labs.  7. At risk for diabetes mellitus Veronica Gay is at higher than average risk for developing diabetes due to her obesity.   Assessment/Plan:   1. Other fatigue Veronica Gay does feel that her weight is causing her energy to be lower than it should be. Fatigue may be related to obesity, depression or many other causes. Labs will be ordered, and in the meanwhile, Veronica Gay will focus on self care including making healthy food choices, increasing physical activity and focusing on stress reduction.  - EKG 12-Lead - Lipid Panel With LDL/HDL Ratio - VITAMIN D 25 Hydroxy (Vit-D Deficiency, Fractures) - Vitamin B12 - T3 - T4, free - TSH  2. SOB (shortness  of breath) on exertion Veronica Gay does feel that she gets out of breath more easily that she used to when she exercises. Veronica Gay's shortness of breath appears to be obesity related and exercise induced. She has agreed to work on weight loss and gradually increase exercise to treat her exercise induced shortness of breath. Will continue to monitor closely.  - Lipid Panel With LDL/HDL  Ratio - VITAMIN D 25 Hydroxy (Vit-D Deficiency, Fractures) - Vitamin B12 - T3 - T4, free - TSH  3. Hypothyroidism, unspecified type Patient with long-standing hypothyroidism, on levothyroxine therapy. She appears euthyroid. We will check labs today. Orders and follow up as documented in patient record.  Counseling . Good thyroid control is important for overall health. Supratherapeutic thyroid levels are dangerous and will not improve weight loss results. . The correct way to take levothyroxine is fasting, with water, separated by at least 30 minutes from breakfast, and separated by more than 4 hours from calcium, iron, multivitamins, acid reflux medications (PPIs).   4. Hyperglycemia Fasting labs will be obtained today and results with be discussed with Veronica Gay in 2 weeks at her follow up visit. In the meanwhile Veronica Gay was started on a lower simple carbohydrate diet and will work on weight loss efforts.  - Comprehensive metabolic panel - Hemoglobin A1c - Insulin, random - Lipid Panel With LDL/HDL Ratio  5. Vitamin D deficiency Low Vitamin D level contributes to fatigue and are associated with obesity, breast, and colon cancer. We will check labs today. Veronica Gay agreed to continue OTC Vit D as is for now. She will follow-up for routine testing of Vitamin D, at least 2-3 times per year to avoid over-replacement.  - CBC with Differential/Platelet - VITAMIN D 25 Hydroxy (Vit-D Deficiency, Fractures)  6. Other iron deficiency anemia We will check labs today. Orders and follow up as documented in patient record..  - CBC with Differential/Platelet - Vitamin B12 - Folate  7. Depression screening Veronica Gay had a positive depression screening. Depression is commonly associated with obesity and often results in emotional eating behaviors. We will monitor this closely and work on CBT to help improve the non-hunger eating patterns. Referral to Psychology may be required if no improvement is seen as she  continues in our clinic.  8. At risk for diabetes mellitus Veronica Gay was given approximately 30 minutes of diabetes education and counseling today. We discussed intensive lifestyle modifications today with an emphasis on weight loss as well as increasing exercise and decreasing simple carbohydrates in her diet. We also reviewed medication options with an emphasis on risk versus benefit of those discussed.   Repetitive spaced learning was employed today to elicit superior memory formation and behavioral change.  9. Class 1 obesity with serious comorbidity and body mass index (BMI) of 34.0 to 34.9 in adult, unspecified obesity type Ainslee is currently in the action stage of change and her goal is to continue with weight loss efforts. I recommend Amaura begin the structured treatment plan as follows:  We discussed various medication options to help Shanay with her weight loss efforts and we both agreed to hold phentermine for now.  She has agreed to the Category 3 Plan.  Exercise goals: No exercise has been prescribed at this time.   Behavioral modification strategies: increasing lean protein intake and meal planning and cooking strategies.  She was informed of the importance of frequent follow-up visits to maximize her success with intensive lifestyle modifications for her multiple health conditions. She was informed we would discuss  her lab results at her next visit unless there is a critical issue that needs to be addressed sooner. Journii agreed to keep her next visit at the agreed upon time to discuss these results.  Objective:   Blood pressure 108/75, pulse 99, temperature 98.2 F (36.8 C), temperature source Oral, height 5\' 4"  (1.626 m), weight 203 lb (92.1 kg), last menstrual period 08/10/2019, SpO2 98 %. Body mass index is 34.84 kg/m.  EKG: Normal sinus rhythm, rate 95 BPM.  Indirect Calorimeter completed today shows a VO2 of 315 and a REE of 2189.  Her calculated basal metabolic rate is  1751 thus her basal metabolic rate is better than expected.  General: Cooperative, alert, well developed, in no acute distress. HEENT: Conjunctivae and lids unremarkable. Cardiovascular: Regular rhythm.  Lungs: Normal work of breathing. Neurologic: No focal deficits.   Lab Results  Component Value Date   CREATININE 0.60 03/29/2019   BUN 10 03/29/2019   NA 139 03/29/2019   K 4.1 03/29/2019   CL 105 03/29/2019   CO2 26 03/29/2019   Lab Results  Component Value Date   ALT 12 10/11/2016   AST 10 10/11/2016   ALKPHOS 36 10/11/2016   BILITOT 0.4 10/11/2016   Lab Results  Component Value Date   HGBA1C 5.5 11/30/2018   No results found for: INSULIN Lab Results  Component Value Date   TSH 0.85 03/29/2019   Lab Results  Component Value Date   CHOL 167 11/30/2018   HDL 43 (L) 11/30/2018   LDLCALC 106 (H) 11/30/2018   TRIG 86 11/30/2018   CHOLHDL 3.9 11/30/2018   Lab Results  Component Value Date   WBC 6.3 11/30/2018   HGB 11.6 (L) 11/30/2018   HCT 35.0 11/30/2018   MCV 78.5 (L) 11/30/2018   PLT 310 11/30/2018   Lab Results  Component Value Date   IRON 62 11/30/2018   TIBC 445 11/30/2018   FERRITIN 4 (L) 11/30/2018   Attestation Statements:   Reviewed by clinician on day of visit: allergies, medications, problem list, medical history, surgical history, family history, social history, and previous encounter notes.   I, Trixie Dredge, am acting as transcriptionist for Dennard Nip, MD.  I have reviewed the above documentation for accuracy and completeness, and I agree with the above. - Dennard Nip, MD

## 2019-08-23 LAB — CBC WITH DIFFERENTIAL/PLATELET
Basophils Absolute: 0.1 10*3/uL (ref 0.0–0.2)
Basos: 1 %
EOS (ABSOLUTE): 0.1 10*3/uL (ref 0.0–0.4)
Eos: 1 %
Hematocrit: 38.6 % (ref 34.0–46.6)
Hemoglobin: 12.8 g/dL (ref 11.1–15.9)
Immature Grans (Abs): 0.1 10*3/uL (ref 0.0–0.1)
Immature Granulocytes: 1 %
Lymphocytes Absolute: 2.5 10*3/uL (ref 0.7–3.1)
Lymphs: 29 %
MCH: 28.7 pg (ref 26.6–33.0)
MCHC: 33.2 g/dL (ref 31.5–35.7)
MCV: 87 fL (ref 79–97)
Monocytes Absolute: 0.6 10*3/uL (ref 0.1–0.9)
Monocytes: 7 %
Neutrophils Absolute: 5.2 10*3/uL (ref 1.4–7.0)
Neutrophils: 61 %
Platelets: 304 10*3/uL (ref 150–450)
RBC: 4.46 x10E6/uL (ref 3.77–5.28)
RDW: 13.2 % (ref 11.7–15.4)
WBC: 8.4 10*3/uL (ref 3.4–10.8)

## 2019-08-23 LAB — COMPREHENSIVE METABOLIC PANEL
ALT: 10 IU/L (ref 0–32)
AST: 14 IU/L (ref 0–40)
Albumin/Globulin Ratio: 2 (ref 1.2–2.2)
Albumin: 4.6 g/dL (ref 3.9–5.0)
Alkaline Phosphatase: 38 IU/L — ABNORMAL LOW (ref 39–117)
BUN/Creatinine Ratio: 17 (ref 9–23)
BUN: 12 mg/dL (ref 6–20)
Bilirubin Total: <0.2 mg/dL (ref 0.0–1.2)
CO2: 23 mmol/L (ref 20–29)
Calcium: 9.8 mg/dL (ref 8.7–10.2)
Chloride: 102 mmol/L (ref 96–106)
Creatinine, Ser: 0.71 mg/dL (ref 0.57–1.00)
GFR calc Af Amer: 132 mL/min/{1.73_m2} (ref >59)
GFR calc non Af Amer: 115 mL/min/{1.73_m2} (ref >59)
Globulin, Total: 2.3 g/dL (ref 1.5–4.5)
Glucose: 93 mg/dL (ref 65–99)
Potassium: 4.4 mmol/L (ref 3.5–5.2)
Sodium: 138 mmol/L (ref 134–144)
Total Protein: 6.9 g/dL (ref 6.0–8.5)

## 2019-08-23 LAB — LIPID PANEL WITH LDL/HDL RATIO
Cholesterol, Total: 205 mg/dL — ABNORMAL HIGH (ref 100–199)
HDL: 46 mg/dL (ref >39)
LDL Chol Calc (NIH): 131 mg/dL — ABNORMAL HIGH (ref 0–99)
LDL/HDL Ratio: 2.8 ratio (ref 0.0–3.2)
Triglycerides: 155 mg/dL — ABNORMAL HIGH (ref 0–149)
VLDL Cholesterol Cal: 28 mg/dL (ref 5–40)

## 2019-08-23 LAB — HEMOGLOBIN A1C
Est. average glucose Bld gHb Est-mCnc: 103 mg/dL
Hgb A1c MFr Bld: 5.2 % (ref 4.8–5.6)

## 2019-08-23 LAB — VITAMIN D 25 HYDROXY (VIT D DEFICIENCY, FRACTURES): Vit D, 25-Hydroxy: 24.7 ng/mL — ABNORMAL LOW (ref 30.0–100.0)

## 2019-08-23 LAB — FOLATE: Folate: 7.1 ng/mL (ref >3.0)

## 2019-08-23 LAB — T4, FREE: Free T4: 1.04 ng/dL (ref 0.82–1.77)

## 2019-08-23 LAB — VITAMIN B12: Vitamin B-12: 383 pg/mL (ref 232–1245)

## 2019-08-23 LAB — TSH: TSH: 1.5 u[IU]/mL (ref 0.450–4.500)

## 2019-08-23 LAB — INSULIN, RANDOM: INSULIN: 23.5 u[IU]/mL (ref 2.6–24.9)

## 2019-08-23 LAB — T3: T3, Total: 140 ng/dL (ref 71–180)

## 2019-09-05 ENCOUNTER — Ambulatory Visit (INDEPENDENT_AMBULATORY_CARE_PROVIDER_SITE_OTHER): Payer: Self-pay | Admitting: Family Medicine

## 2019-09-10 ENCOUNTER — Encounter (INDEPENDENT_AMBULATORY_CARE_PROVIDER_SITE_OTHER): Payer: Self-pay | Admitting: Family Medicine

## 2019-09-10 ENCOUNTER — Ambulatory Visit (INDEPENDENT_AMBULATORY_CARE_PROVIDER_SITE_OTHER): Payer: No Typology Code available for payment source | Admitting: Family Medicine

## 2019-09-12 ENCOUNTER — Other Ambulatory Visit: Payer: Self-pay

## 2019-09-12 ENCOUNTER — Encounter (INDEPENDENT_AMBULATORY_CARE_PROVIDER_SITE_OTHER): Payer: Self-pay | Admitting: Family Medicine

## 2019-09-12 ENCOUNTER — Ambulatory Visit (INDEPENDENT_AMBULATORY_CARE_PROVIDER_SITE_OTHER): Payer: No Typology Code available for payment source | Admitting: Family Medicine

## 2019-09-12 VITALS — BP 122/84 | HR 101 | Temp 98.0°F | Ht 64.0 in | Wt 202.0 lb

## 2019-09-12 DIAGNOSIS — E782 Mixed hyperlipidemia: Secondary | ICD-10-CM

## 2019-09-12 DIAGNOSIS — E669 Obesity, unspecified: Secondary | ICD-10-CM

## 2019-09-12 DIAGNOSIS — E559 Vitamin D deficiency, unspecified: Secondary | ICD-10-CM

## 2019-09-12 DIAGNOSIS — Z9189 Other specified personal risk factors, not elsewhere classified: Secondary | ICD-10-CM

## 2019-09-12 DIAGNOSIS — E8881 Metabolic syndrome: Secondary | ICD-10-CM

## 2019-09-12 DIAGNOSIS — Z6834 Body mass index (BMI) 34.0-34.9, adult: Secondary | ICD-10-CM

## 2019-09-12 MED ORDER — VITAMIN D (ERGOCALCIFEROL) 1.25 MG (50000 UNIT) PO CAPS: 50000.0000 [IU] | ORAL_CAPSULE | ORAL | 0 refills | Status: DC

## 2019-09-12 MED FILL — VIT D2 1.25 MG (50,000 UNIT: 1.25 MG | 28 days supply | Qty: 4 | Fill #0

## 2019-09-16 NOTE — Progress Notes (Signed)
Chief Complaint:   OBESITY Veronica Gay is here to discuss her progress with her obesity treatment plan along with follow-up of her obesity related diagnoses. Veronica Gay is on the Category 3 Plan and states she is following her eating plan approximately 85% of the time. Veronica Gay states she is doing 0 minutes 0 times per week.  Today's visit was #: 2 Starting weight: 203 lbs Starting date: 08/22/2019 Today's weight: 202 lbs Today's date: 09/12/2019 Total lbs lost to date: 1 Total lbs lost since last in-office visit: 1  Interim History: Veronica Gay did well following her Category 3 plan. Her hunger was controlled even off phentermine. She states she feels better on this eating plan with increased energy. She struggled to eat breakfast the most.  Subjective:   1. Vitamin D deficiency Veronica Gay's Vit D level is worsening and is not at goal, and she denies nausea or vomiting. She is on OTC Vit D 5,000 IU. I discussed labs with the patient today.  2. Insulin resistance Veronica Gay has a new diagnosis of insulin resistance. She has a normal glucose and A1c, but her fasting insulin is elevated even on a lower carbohydrate plan. I discussed labs with the patient today.  3. Mixed hyperlipidemia Veronica Gay's triglycerides are elevated, HDL is low, and her LDL is elevated. She is not on statin, and she is working on diet and exercise.  4. At risk for hyperglycemia Veronica Gay is at increased risk for hyperglycemia due to new diagnosis of insulin resistance.  Assessment/Plan:   1. Vitamin D deficiency Low Vitamin D level contributes to fatigue and are associated with obesity, breast, and colon cancer. Veronica Gay agreed to continue taking OTC Vit D, and start prescription Vitamin D 50,000 IU every week with no refill. She will follow-up for routine testing of Vitamin D, at least 2-3 times per year to avoid over-replacement.  - Vitamin D, Ergocalciferol, (DRISDOL) 1.25 MG (50000 UNIT) CAPS capsule; Take 1 capsule (50,000 Units total) by  mouth every 7 (seven) days.  Dispense: 4 capsule; Refill: 0  2. Insulin resistance Veronica Gay defer metformin for now, and will continue to work on weight loss, diet, exercise, and decreasing simple carbohydrates to help decrease the risk of diabetes. Veronica Gay agreed to follow-up with [place] as directed to closely monitor her progress. We will recheck labs in 3 months.  3. Mixed hyperlipidemia Cardiovascular risk and specific lipid/LDL goals reviewed. We discussed several lifestyle modifications today and Veronica Gay will continue to work on diet, exercise and weight loss efforts. We will recheck labs in 3 months. Orders and follow up as documented in patient record.   4. At risk for hyperglycemia Veronica Gay was given approximately 30 minutes of counseling today regarding prevention of hyperglycemia. She was advised of hyperglycemia causes and the fact hyperglycemia is often asymptomatic. Veronica Gay was instructed to avoid skipping meals, eat regular protein rich meals and schedule low calorie but protein rich snacks as needed.   Repetitive spaced learning was employed today to elicit superior memory formation and behavioral change  5. Class 1 obesity with serious comorbidity and body mass index (BMI) of 34.0 to 34.9 in adult, unspecified obesity type Veronica Gay is currently in the action stage of change. As such, her goal is to continue with weight loss efforts. She has agreed to the Category 3 Plan with breakfast options.   Behavioral modification strategies: increasing lean protein intake and no skipping meals.  Veronica Gay has agreed to follow-up with our clinic in 2 weeks. She was informed of the  importance of frequent follow-up visits to maximize her success with intensive lifestyle modifications for her multiple health conditions.   Objective:   Blood pressure 122/84, pulse (!) 101, temperature 98 F (36.7 C), height 5\' 4"  (1.626 m), weight 202 lb (91.6 kg), SpO2 95 %. Body mass index is 34.67 kg/m.  General:  Cooperative, alert, well developed, in no acute distress. HEENT: Conjunctivae and lids unremarkable. Cardiovascular: Regular rhythm.  Lungs: Normal work of breathing. Neurologic: No focal deficits.   Lab Results  Component Value Date   CREATININE 0.71 08/22/2019   BUN 12 08/22/2019   NA 138 08/22/2019   K 4.4 08/22/2019   CL 102 08/22/2019   CO2 23 08/22/2019   Lab Results  Component Value Date   ALT 10 08/22/2019   AST 14 08/22/2019   ALKPHOS 38 (L) 08/22/2019   BILITOT <0.2 08/22/2019   Lab Results  Component Value Date   HGBA1C 5.2 08/22/2019   HGBA1C 5.5 11/30/2018   Lab Results  Component Value Date   INSULIN 23.5 08/22/2019   Lab Results  Component Value Date   TSH 1.500 08/22/2019   Lab Results  Component Value Date   CHOL 205 (H) 08/22/2019   HDL 46 08/22/2019   LDLCALC 131 (H) 08/22/2019   TRIG 155 (H) 08/22/2019   CHOLHDL 3.9 11/30/2018   Lab Results  Component Value Date   WBC 8.4 08/22/2019   HGB 12.8 08/22/2019   HCT 38.6 08/22/2019   MCV 87 08/22/2019   PLT 304 08/22/2019   Lab Results  Component Value Date   IRON 62 11/30/2018   TIBC 445 11/30/2018   FERRITIN 4 (L) 11/30/2018   Attestation Statements:   Reviewed by clinician on day of visit: allergies, medications, problem list, medical history, surgical history, family history, social history, and previous encounter notes.   I, 12/02/2018, am acting as transcriptionist for Burt Knack, MD.  I have reviewed the above documentation for accuracy and completeness, and I agree with the above. -  Quillian Quince, MD

## 2019-09-24 ENCOUNTER — Encounter: Payer: Self-pay | Admitting: Endocrinology

## 2019-09-26 ENCOUNTER — Ambulatory Visit: Payer: BLUE CROSS/BLUE SHIELD | Admitting: Endocrinology

## 2019-10-01 ENCOUNTER — Ambulatory Visit (INDEPENDENT_AMBULATORY_CARE_PROVIDER_SITE_OTHER): Payer: No Typology Code available for payment source | Admitting: Family Medicine

## 2019-10-01 ENCOUNTER — Encounter (INDEPENDENT_AMBULATORY_CARE_PROVIDER_SITE_OTHER): Payer: Self-pay | Admitting: Family Medicine

## 2019-10-08 ENCOUNTER — Ambulatory Visit (INDEPENDENT_AMBULATORY_CARE_PROVIDER_SITE_OTHER): Payer: No Typology Code available for payment source | Admitting: Family Medicine

## 2019-10-22 ENCOUNTER — Encounter: Payer: Self-pay | Admitting: Medical-Surgical

## 2019-11-19 ENCOUNTER — Telehealth: Payer: No Typology Code available for payment source | Admitting: Physician Assistant

## 2019-11-19 DIAGNOSIS — K529 Noninfective gastroenteritis and colitis, unspecified: Secondary | ICD-10-CM

## 2019-11-19 MED ORDER — SUCRALFATE 1 G PO TABS: 1.0000 g | ORAL_TABLET | Freq: Three times a day (TID) | ORAL | 0 refills | Status: DC

## 2019-11-19 MED ORDER — ONDANSETRON 4 MG PO TBDP: ORAL_TABLET | ORAL | 0 refills | Status: DC

## 2019-11-19 MED ORDER — DICYCLOMINE HCL 20 MG PO TABS: 20.0000 mg | ORAL_TABLET | Freq: Two times a day (BID) | ORAL | 0 refills | Status: DC | PRN

## 2019-11-19 MED ORDER — PANTOPRAZOLE SODIUM 40 MG PO TBEC: 40.0000 mg | DELAYED_RELEASE_TABLET | Freq: Every day | ORAL | 3 refills | Status: DC

## 2019-11-19 NOTE — Progress Notes (Signed)
Thanks for the additional information, it was very helpful. We are sorry that you are not feeling well. Here is how we plan to help!  I suspect that you are having some prolonged symptoms from a viral gastroenteritis/gastritis.  I am going to prescribe some medications to help with the heartburn (protonix and carafate), cramping (bentyl) and nausea/diarrhea (zofran).  You may continue to take the imodium for diarrhea.  If your pain worsens in any way, the medications do not help or you see blood you will need to be urgently evaluated at an urgent care or in the emergency room.  Based on what you have shared with me it looks like you have a Virus that is irritating your GI tract.  Vomiting is the forceful emptying of a portion of the stomach's content through the mouth.  Although nausea and vomiting can make you feel miserable, it's important to remember that these are not diseases, but rather symptoms of an underlying illness.  When we treat short term symptoms, we always caution that any symptoms that persist should be fully evaluated in a medical office.  I have prescribed a medication that will help alleviate your symptoms and allow you to stay hydrated:  Zofran 4 mg 1 tablet every 8 hours as needed for nausea and vomiting  HOME CARE:  Drink clear liquids.  This is very important! Dehydration (the lack of fluid) can lead to a serious complication.  Start off with 1 tablespoon every 5 minutes for 8 hours.  You may begin eating bland foods after 8 hours without vomiting.  Start with saltine crackers, white bread, rice, mashed potatoes, applesauce.  After 48 hours on a bland diet, you may resume a normal diet.  Try to go to sleep.  Sleep often empties the stomach and relieves the need to vomit.  GET HELP RIGHT AWAY IF:   Your symptoms do not improve or worsen within 2 days after treatment.  You have a fever for over 3 days.  You cannot keep down fluids after trying the medication.  MAKE  SURE YOU:   Understand these instructions.  Will watch your condition.  Will get help right away if you are not doing well or get worse.   Thank you for choosing an e-visit. Your e-visit answers were reviewed by a board certified advanced clinical practitioner to complete your personal care plan. Depending upon the condition, your plan could have included both over the counter or prescription medications. Please review your pharmacy choice. Be sure that the pharmacy you have chosen is open so that you can pick up your prescription now.  If there is a problem you may message your provider in MyChart to have the prescription routed to another pharmacy. Your safety is important to [place]. If you have drug allergies check your prescription carefully.  For the next 24 hours, you can use MyChart to ask questions about today's visit, request a non-urgent call back, or ask for a work or school excuse from your e-visit provider. You will get an e-mail in the next two days asking about your experience. I hope that your e-visit has been valuable and will speed your recovery.   Greater than 5 minutes, yet less than 10 minutes of time have been spent researching, coordinating, and implementing care for this patient today

## 2020-01-02 ENCOUNTER — Encounter: Payer: Self-pay | Admitting: Osteopathic Medicine

## 2020-01-02 DIAGNOSIS — M549 Dorsalgia, unspecified: Secondary | ICD-10-CM

## 2020-01-02 MED FILL — SPIRONOLACTONE 100 MG TAB: 100 | 90 days supply | Qty: 90 | Fill #1

## 2020-01-02 MED FILL — DULOXETINE HCL 60 MG CPEP: 60 | 90 days supply | Qty: 180 | Fill #1

## 2020-01-02 MED FILL — LEVOTHYROXINE 50 MCG TABLET: 50 | 90 days supply | Qty: 90 | Fill #1

## 2020-01-02 MED FILL — DOXYCYCLINE HYCLATE 100 MG: 100 | 90 days supply | Qty: 90 | Fill #1

## 2020-02-18 ENCOUNTER — Encounter: Payer: Self-pay | Admitting: Osteopathic Medicine

## 2020-03-26 ENCOUNTER — Other Ambulatory Visit: Payer: Self-pay | Admitting: *Deleted

## 2020-03-26 ENCOUNTER — Other Ambulatory Visit (HOSPITAL_COMMUNITY): Payer: Self-pay | Admitting: Osteopathic Medicine

## 2020-03-26 DIAGNOSIS — E039 Hypothyroidism, unspecified: Secondary | ICD-10-CM

## 2020-03-26 MED ORDER — LEVOTHYROXINE SODIUM 50 MCG PO TABS: 50.0000 ug | ORAL_TABLET | Freq: Every day | ORAL | 1 refills | Status: AC

## 2020-03-26 MED ORDER — DULOXETINE HCL 60 MG PO CPEP: 60.0000 mg | ORAL_CAPSULE | Freq: Two times a day (BID) | ORAL | 1 refills | Status: DC

## 2020-03-26 MED FILL — LEVOTHYROXINE 50 MCG TABLET: 50 | 90 days supply | Qty: 90 | Fill #0

## 2020-03-26 MED FILL — SPIRONOLACTONE 100 MG TAB: 100 | 90 days supply | Qty: 90 | Fill #2

## 2020-03-26 MED FILL — DULOXETINE HCL 60 MG CPEP: 60 | 90 days supply | Qty: 180 | Fill #0

## 2020-03-27 ENCOUNTER — Other Ambulatory Visit: Payer: Self-pay | Admitting: Family Medicine

## 2020-03-27 DIAGNOSIS — L700 Acne vulgaris: Secondary | ICD-10-CM

## 2020-03-27 MED FILL — DOXYCYCLINE HYCLATE 100 MG: 100 | 90 days supply | Qty: 90 | Fill #0

## 2020-03-31 ENCOUNTER — Other Ambulatory Visit: Payer: Self-pay | Admitting: Family Medicine

## 2020-04-01 ENCOUNTER — Other Ambulatory Visit (HOSPITAL_COMMUNITY): Payer: Self-pay | Admitting: Osteopathic Medicine

## 2020-06-18 MED FILL — DULOXETINE HCL 60 MG CPEP: 60 | 90 days supply | Qty: 180 | Fill #1

## 2020-06-24 ENCOUNTER — Other Ambulatory Visit: Payer: Self-pay | Admitting: Endocrinology

## 2020-06-25 MED FILL — spIRONOLACTONE 100 MG TAB: 100 | 90 days supply | Qty: 90 | Fill #0

## 2020-06-26 ENCOUNTER — Other Ambulatory Visit: Payer: Self-pay | Admitting: Endocrinology

## 2020-06-26 NOTE — Telephone Encounter (Signed)
1.  Please schedule f/u appt 2.  Then please refill x 2 mos, pending that appt.  

## 2020-06-26 NOTE — Telephone Encounter (Signed)
Message was sent to the patient to contact office to schedule appt. Looks like a 90 day supply was already sent to the pharmacy on the 8th.

## 2020-06-26 NOTE — Telephone Encounter (Signed)
Last OV 03/29/2019 Ok to refill?

## 2020-06-27 ENCOUNTER — Emergency Department
Admission: EM | Admit: 2020-06-27 | Discharge: 2020-06-27 | Disposition: A | Discharge status: Home / Self Care | Payer: No Typology Code available for payment source | Source: Home / Self Care

## 2020-06-27 ENCOUNTER — Encounter (HOSPITAL_BASED_OUTPATIENT_CLINIC_OR_DEPARTMENT_OTHER): Payer: Self-pay | Admitting: Emergency Medicine

## 2020-06-27 ENCOUNTER — Other Ambulatory Visit: Payer: Self-pay

## 2020-06-27 ENCOUNTER — Emergency Department (HOSPITAL_BASED_OUTPATIENT_CLINIC_OR_DEPARTMENT_OTHER): Payer: No Typology Code available for payment source

## 2020-06-27 ENCOUNTER — Inpatient Hospital Stay (HOSPITAL_BASED_OUTPATIENT_CLINIC_OR_DEPARTMENT_OTHER)
Admission: EM | Admit: 2020-06-27 | Discharge: 2020-06-29 | DRG: 854 | Disposition: A | Discharge status: Home / Self Care | Payer: No Typology Code available for payment source | Attending: Internal Medicine | Admitting: Internal Medicine

## 2020-06-27 DIAGNOSIS — E876 Hypokalemia: Secondary | ICD-10-CM | POA: Diagnosis present

## 2020-06-27 DIAGNOSIS — E282 Polycystic ovarian syndrome: Secondary | ICD-10-CM | POA: Diagnosis present

## 2020-06-27 DIAGNOSIS — M797 Fibromyalgia: Secondary | ICD-10-CM | POA: Diagnosis present

## 2020-06-27 DIAGNOSIS — Z833 Family history of diabetes mellitus: Secondary | ICD-10-CM

## 2020-06-27 DIAGNOSIS — E785 Hyperlipidemia, unspecified: Secondary | ICD-10-CM | POA: Diagnosis present

## 2020-06-27 DIAGNOSIS — L03311 Cellulitis of abdominal wall: Secondary | ICD-10-CM

## 2020-06-27 DIAGNOSIS — L02211 Cutaneous abscess of abdominal wall: Secondary | ICD-10-CM | POA: Diagnosis present

## 2020-06-27 DIAGNOSIS — E66812 Obesity, class 2: Secondary | ICD-10-CM | POA: Diagnosis present

## 2020-06-27 DIAGNOSIS — L0291 Cutaneous abscess, unspecified: Secondary | ICD-10-CM | POA: Diagnosis present

## 2020-06-27 DIAGNOSIS — Z818 Family history of other mental and behavioral disorders: Secondary | ICD-10-CM

## 2020-06-27 DIAGNOSIS — E119 Type 2 diabetes mellitus without complications: Secondary | ICD-10-CM

## 2020-06-27 DIAGNOSIS — D509 Iron deficiency anemia, unspecified: Secondary | ICD-10-CM | POA: Diagnosis present

## 2020-06-27 DIAGNOSIS — E039 Hypothyroidism, unspecified: Secondary | ICD-10-CM | POA: Diagnosis present

## 2020-06-27 DIAGNOSIS — Z6841 Body Mass Index (BMI) 40.0 and over, adult: Secondary | ICD-10-CM

## 2020-06-27 DIAGNOSIS — Z807 Family history of other malignant neoplasms of lymphoid, hematopoietic and related tissues: Secondary | ICD-10-CM

## 2020-06-27 DIAGNOSIS — A419 Sepsis, unspecified organism: Secondary | ICD-10-CM | POA: Diagnosis not present

## 2020-06-27 DIAGNOSIS — Z8632 Personal history of gestational diabetes: Secondary | ICD-10-CM

## 2020-06-27 DIAGNOSIS — Z79890 Hormone replacement therapy (postmenopausal): Secondary | ICD-10-CM

## 2020-06-27 DIAGNOSIS — R5382 Chronic fatigue, unspecified: Secondary | ICD-10-CM | POA: Diagnosis present

## 2020-06-27 DIAGNOSIS — Z801 Family history of malignant neoplasm of trachea, bronchus and lung: Secondary | ICD-10-CM

## 2020-06-27 DIAGNOSIS — Z881 Allergy status to other antibiotic agents status: Secondary | ICD-10-CM

## 2020-06-27 DIAGNOSIS — Z20822 Contact with and (suspected) exposure to covid-19: Secondary | ICD-10-CM | POA: Diagnosis present

## 2020-06-27 DIAGNOSIS — E669 Obesity, unspecified: Secondary | ICD-10-CM | POA: Diagnosis present

## 2020-06-27 DIAGNOSIS — Z79899 Other long term (current) drug therapy: Secondary | ICD-10-CM

## 2020-06-27 DIAGNOSIS — Z803 Family history of malignant neoplasm of breast: Secondary | ICD-10-CM

## 2020-06-27 DIAGNOSIS — R6510 Systemic inflammatory response syndrome (SIRS) of non-infectious origin without acute organ dysfunction: Secondary | ICD-10-CM

## 2020-06-27 LAB — URINALYSIS, ROUTINE W REFLEX MICROSCOPIC
Bilirubin Urine: NEGATIVE
Glucose, UA: NEGATIVE mg/dL
Hgb urine dipstick: NEGATIVE
Ketones, ur: NEGATIVE mg/dL
Leukocytes,Ua: NEGATIVE
Nitrite: NEGATIVE
Protein, ur: NEGATIVE mg/dL
Specific Gravity, Urine: 1.015 (ref 1.005–1.030)
pH: 7 (ref 5.0–8.0)

## 2020-06-27 LAB — PREGNANCY, URINE: Preg Test, Ur: NEGATIVE

## 2020-06-27 LAB — CBC WITH DIFFERENTIAL/PLATELET
Abs Immature Granulocytes: 0.14 10*3/uL — ABNORMAL HIGH (ref 0.00–0.07)
Basophils Absolute: 0.1 10*3/uL (ref 0.0–0.1)
Basophils Relative: 1 %
Eosinophils Absolute: 0.4 10*3/uL (ref 0.0–0.5)
Eosinophils Relative: 3 %
HCT: 36.3 % (ref 36.0–46.0)
Hemoglobin: 12.5 g/dL (ref 12.0–15.0)
Immature Granulocytes: 1 %
Lymphocytes Relative: 19 %
Lymphs Abs: 2.9 10*3/uL (ref 0.7–4.0)
MCH: 28 pg (ref 26.0–34.0)
MCHC: 34.4 g/dL (ref 30.0–36.0)
MCV: 81.4 fL (ref 80.0–100.0)
Monocytes Absolute: 1 10*3/uL (ref 0.1–1.0)
Monocytes Relative: 7 %
Neutro Abs: 10.4 10*3/uL — ABNORMAL HIGH (ref 1.7–7.7)
Neutrophils Relative %: 69 %
Platelets: 330 10*3/uL (ref 150–400)
RBC: 4.46 MIL/uL (ref 3.87–5.11)
RDW: 12.9 % (ref 11.5–15.5)
WBC: 14.9 10*3/uL — ABNORMAL HIGH (ref 4.0–10.5)
nRBC: 0 % (ref 0.0–0.2)

## 2020-06-27 LAB — COMPREHENSIVE METABOLIC PANEL
ALT: 22 U/L (ref 0–44)
AST: 26 U/L (ref 15–41)
Albumin: 4 g/dL (ref 3.5–5.0)
Alkaline Phosphatase: 43 U/L (ref 38–126)
Anion gap: 14 (ref 5–15)
BUN: 11 mg/dL (ref 6–20)
CO2: 21 mmol/L — ABNORMAL LOW (ref 22–32)
Calcium: 9.1 mg/dL (ref 8.9–10.3)
Chloride: 100 mmol/L (ref 98–111)
Creatinine, Ser: 0.74 mg/dL (ref 0.44–1.00)
GFR, Estimated: >60 mL/min (ref >60)
Glucose, Bld: 148 mg/dL — ABNORMAL HIGH (ref 70–99)
Potassium: 3.3 mmol/L — ABNORMAL LOW (ref 3.5–5.1)
Sodium: 135 mmol/L (ref 135–145)
Total Bilirubin: 0.3 mg/dL (ref 0.3–1.2)
Total Protein: 7.3 g/dL (ref 6.5–8.1)

## 2020-06-27 LAB — LACTIC ACID, PLASMA
Lactic Acid, Venous: 3.4 mmol/L (ref 0.5–1.9)
Lactic Acid, Venous: 1.1 mmol/L (ref 0.5–1.9)

## 2020-06-27 LAB — PROTIME-INR
INR: 0.9 (ref 0.8–1.2)
Prothrombin Time: 12 seconds (ref 11.4–15.2)

## 2020-06-27 LAB — RESP PANEL BY RT-PCR (FLU A&B, COVID) ARPGX2
Influenza A by PCR: NEGATIVE
Influenza B by PCR: NEGATIVE
SARS Coronavirus 2 by RT PCR: NEGATIVE

## 2020-06-27 LAB — C-REACTIVE PROTEIN: CRP: 2.3 mg/dL — ABNORMAL HIGH (ref <1.0)

## 2020-06-27 MED ORDER — IOHEXOL 300 MG/ML  SOLN
100.0000 mL | Freq: Once | INTRAMUSCULAR | Status: AC
  Administered 2020-06-27: 19:00:00 80 mL via INTRAVENOUS

## 2020-06-27 MED ORDER — PIPERACILLIN-TAZOBACTAM 3.375 G IVPB 30 MIN
3.3750 g | Freq: Once | INTRAVENOUS | Status: AC
  Administered 2020-06-27: 21:00:00 3.375 g via INTRAVENOUS
  Filled 2020-06-27: qty 50

## 2020-06-27 MED ORDER — SODIUM CHLORIDE 0.9 % IV SOLN
INTRAVENOUS | Status: DC | PRN
  Administered 2020-06-27: 18:00:00 1000 mL via INTRAVENOUS

## 2020-06-27 MED ORDER — ACETAMINOPHEN 325 MG PO TABS
ORAL_TABLET | ORAL | Status: AC
  Administered 2020-06-27: 18:00:00 650 mg via ORAL
  Filled 2020-06-27: qty 2

## 2020-06-27 MED ORDER — SODIUM CHLORIDE 0.9 % IV BOLUS
1000.0000 mL | Freq: Once | INTRAVENOUS | Status: AC
Start: 2020-06-27 — End: 2020-06-27
  Administered 2020-06-27: 19:00:00 1000 mL via INTRAVENOUS

## 2020-06-27 MED ORDER — CLINDAMYCIN PHOSPHATE 600 MG/50ML IV SOLN
600.0000 mg | Freq: Once | INTRAVENOUS | Status: AC
  Administered 2020-06-27: 18:00:00 600 mg via INTRAVENOUS
  Filled 2020-06-27: qty 50

## 2020-06-27 MED ORDER — KETOROLAC TROMETHAMINE 30 MG/ML IJ SOLN
30.0000 mg | Freq: Once | INTRAMUSCULAR | Status: AC
  Administered 2020-06-27: 30 mg via INTRAVENOUS
  Filled 2020-06-27: qty 1

## 2020-06-27 MED ORDER — VANCOMYCIN HCL IN DEXTROSE 1-5 GM/200ML-% IV SOLN
1000.0000 mg | Freq: Once | INTRAVENOUS | Status: AC
  Administered 2020-06-27: 20:00:00 1000 mg via INTRAVENOUS
  Filled 2020-06-27: qty 200

## 2020-06-27 MED ORDER — ACETAMINOPHEN 325 MG PO TABS: 650.0000 mg | ORAL_TABLET | Freq: Once | ORAL | Status: AC

## 2020-06-27 MED ORDER — DIPHENHYDRAMINE HCL 50 MG PO CAPS: 50.0000 mg | ORAL_CAPSULE | Freq: Once | ORAL | Status: DC | PRN

## 2020-06-27 NOTE — ED Notes (Signed)
Patient is being discharged from the Urgent Care and sent to the Emergency Department via POV driven by husband. Per Waylan Rocher, patient is in need of higher level of care due to possible sepis. Sent for evaluation and tx. Patient is aware and verbalizes understanding of plan of care.  Vitals:   06/27/20 1614 06/27/20 1615  BP: (!) 160/100   Pulse: (!) 137 (!) 124  Resp: 18   Temp: 98.4 F (36.9 C)   SpO2: 98%

## 2020-06-27 NOTE — ED Notes (Signed)
ED Provider at bedside. 

## 2020-06-27 NOTE — ED Triage Notes (Signed)
Pt says she noticed a cyst under her LT breast. Painful yesterday, worsening today. Red, hot to touch and foul odor. Has not noticed any draining. Pain 6/10  In addition to the pain, having episodes of tachycardia and shaking/dizzy feeling like her BP is "bottoming out".

## 2020-06-27 NOTE — ED Provider Notes (Signed)
[facility] EMERGENCY DEPARTMENT Provider Note   CSN: [id number] Arrival date & time: 06/27/20  1645     History Chief Complaint  Patient presents with  . Tachycardia  . Wound Check    Veronica Gay is a 29 y.o. female with PMH of anxiety sent here from urgent care with concern for sepsis in context of wound under left breast.  On my examination, patient tells me that she has been feeling generalized malaise x7 days.  48 hours ago she developed pain and tenderness under her left breast, but no obvious overlying skin changes.  However, she has now noticed development of erythema and swelling that is exquisitely tender to palpation.  In addition to the pain and tenderness, she is also complaining of shortness of breath, particularly with exertion.  Rectal temperature obtained here in the ED shows that she is febrile to 101.6 F in addition to tachycardia and increased respiratory effort.    We will initiate code sepsis start her on broad-spectrum antibiotics.  She denies any exertional chest pain, history of ACS, history of clots or clotting disorder, recent surgeries or immobilizations, abdominal pain, nausea or vomiting, or other symptoms.  Per husband at bedside, she has not been eating or drinking well the past 24 hours.  HPI     Past Medical History:  Diagnosis Date  . ANEMIA, IRON DEFICIENCY 04/23/2010   Qualifier: Diagnosis of  By: Madilyn Fireman MD, Barnetta Chapel    . Anxiety   . Back pain   . CFS (chronic fatigue syndrome)   . Complicated migraine    last episode 2012  . Diabetes mellitus without complication ([place])   . Dyslipidemia 12/03/2018  . Fibromyalgia   . History of gestational diabetes   . Hypothyroidism   . IBS (irritable bowel syndrome)   . Joint pain   . Metabolic syndrome   . PCOS (polycystic ovarian syndrome)   . Vitamin D deficiency     Patient Active Problem List   Diagnosis Date Noted  . PCOS (polycystic ovarian syndrome) 12/25/2018  . Acne  vulgaris 12/25/2018  . Hirsutism 12/25/2018  . Obesity due to endocrine disorder 12/25/2018  . Dysmenorrhea 12/25/2018  . Dyslipidemia 12/03/2018  . Class 1 obesity due to excess calories without serious comorbidity with body mass index (BMI) of 33.0 to 33.9 in adult 06/13/2018  . Chronic fatigue 02/21/2018  . Arthralgia of multiple joints 02/21/2018  . Generalized anxiety disorder 02/21/2018  . Hypothyroidism   . Complicated migraine   . History of gestational diabetes   . Vitamin D insufficiency 04/23/2010  . Iron deficiency anemia due to chronic blood loss 04/23/2010    Past Surgical History:  Procedure Laterality Date  . CESAREAN SECTION    . TONSILLECTOMY       OB History    Gravida  2   Para  2   Term      Preterm      AB      Living  2     SAB      IAB      Ectopic      Multiple      Live Births              Family History  Problem Relation Age of Onset  . Lung cancer Other        grandfather  . Depression Other        mom,sister  . Diabetes Other  grandmother,grandfather  . Hypertension Other        father  . Thyroid disease Other        father  . Diabetes Mother   . Anxiety disorder Mother   . Depression Mother   . Sleep apnea Mother   . Eating disorder Mother   . Diabetes Father   . Fibromyalgia Father   . Multiple myeloma Father   . Anxiety disorder Father   . Depression Father   . Breast cancer Maternal Aunt   . Breast cancer Maternal Grandmother   . Lung cancer Maternal Grandfather   . Diabetes Paternal Grandfather   . Anxiety disorder Sister     Social History   Tobacco Use  . Smoking status: Never Smoker  . Smokeless tobacco: Never Used  Vaping Use  . Vaping Use: Never used  Substance Use Topics  . Alcohol use: No  . Drug use: No    Home Medications Prior to Admission medications   Medication Sig Start Date End Date Taking? Authorizing Provider  dicyclomine (BENTYL) 20 MG tablet Take 1 tablet (20 mg  total) by mouth 2 (two) times daily as needed for spasms (abdominal cramping). 11/19/19   Muthersbaugh, Jarrett Soho, PA-C  doxycycline (VIBRA-TABS) 100 MG tablet TAKE 1 TABLET (100 MG TOTAL) BY MOUTH DAILY. 03/27/20   Emeterio Reeve, DO  doxycycline (VIBRAMYCIN) 100 MG capsule Take 100 mg by mouth daily.    [provider]  DULoxetine (CYMBALTA) 60 MG capsule Take 1 capsule (60 mg total) by mouth 2 (two) times daily. 03/26/20   Emeterio Reeve, DO  ferrous sulfate 325 (65 FE) MG tablet Take 325 mg by mouth daily.    [provider]  levothyroxine (SYNTHROID) 50 MCG tablet Take 1 tablet (50 mcg total) by mouth daily before breakfast. Due for labs 03/26/20   Emeterio Reeve, DO  Multiple Vitamins-Minerals (CENTRUM ADULTS PO) Take 1 tablet by mouth daily.    [provider]  Norgestimate-Ethinyl Estradiol Triphasic 0.18/0.215/0.25 MG-25 MCG tab Take 1 tablet by mouth daily. 06/03/19   Renato Shin, MD  ondansetron (ZOFRAN ODT) 4 MG disintegrating tablet 4mg  ODT q4 hours prn nausea/vomit 11/19/19   Muthersbaugh, Jarrett Soho, PA-C  pantoprazole (PROTONIX) 40 MG tablet Take 1 tablet (40 mg total) by mouth daily. 11/19/19   Muthersbaugh, Jarrett Soho, PA-C  spironolactone (ALDACTONE) 100 MG tablet TAKE 1 TABLET BY MOUTH ONCE DAILY 06/24/20   Renato Shin, MD  sucralfate (CARAFATE) 1 g tablet Take 1 tablet (1 g total) by mouth 4 (four) times daily -  with meals and at bedtime. 11/19/19   Muthersbaugh, Jarrett Soho, PA-C  VITAMIN D PO Take 1 capsule by mouth daily. 5000iu daily    [provider]  Vitamin D, Ergocalciferol, (DRISDOL) 1.25 MG (50000 UNIT) CAPS capsule Take 1 capsule (50,000 Units total) by mouth every 7 (seven) days. 09/12/19   Dennard Nip D, MD    Allergies    Cephalosporins  Review of Systems   Review of Systems  All other systems reviewed and are negative.   Physical Exam Updated Vital Signs BP 132/86   Pulse (!) 105   Temp 98.5 F (36.9 C) (Oral)   Resp 13   Ht  5\' 3"  (1.6 m)   Wt 97.5 kg   SpO2 99%   BMI 38.09 kg/m   Physical Exam Vitals and nursing note reviewed. Exam conducted with a chaperone present.  Constitutional:      Appearance: Normal appearance.  HENT:     Head:  Normocephalic and atraumatic.  Eyes:     General: No scleral icterus.    Conjunctiva/sclera: Conjunctivae normal.  Cardiovascular:     Rate and Rhythm: Regular rhythm. Tachycardia present.     Pulses: Normal pulses.     Heart sounds: Normal heart sounds.  Pulmonary:     Effort: Pulmonary effort is normal. No respiratory distress.     Comments: Mild tachypnea and mild increased work of breathing.  Speaks in full sentences.  No distress or oxygen requirement.   Skin:    General: Skin is dry.     Capillary Refill: Capillary refill takes less than 2 seconds.     Comments: Irregular, 9 x 4 cm erythematous lesion under left breast, mild crepitus noted.  Exquisite tenderness to even light palpation.  Neurological:     Mental Status: She is alert.     GCS: GCS eye subscore is 4. GCS verbal subscore is 5. GCS motor subscore is 6.  Psychiatric:        Mood and Affect: Mood normal.        Behavior: Behavior normal.        Thought Content: Thought content normal.     ED Results / Procedures / Treatments   Labs (all labs ordered are listed, but only abnormal results are displayed) Labs Reviewed  COMPREHENSIVE METABOLIC PANEL - Abnormal; Notable for the following components:      Result Value   Potassium 3.3 (*)    CO2 21 (*)    Glucose, Bld 148 (*)    All other components within normal limits  LACTIC ACID, PLASMA - Abnormal; Notable for the following components:   Lactic Acid, Venous 3.4 (*)    All other components within normal limits  CBC WITH DIFFERENTIAL/PLATELET - Abnormal; Notable for the following components:   WBC 14.9 (*)    Neutro Abs 10.4 (*)    Abs Immature Granulocytes 0.14 (*)    All other components within normal limits  CULTURE, BLOOD (ROUTINE X 2)   CULTURE, BLOOD (ROUTINE X 2)  RESP PANEL BY RT-PCR (FLU A&B, COVID) ARPGX2  LACTIC ACID, PLASMA  PROTIME-INR  URINALYSIS, ROUTINE W REFLEX MICROSCOPIC  PREGNANCY, URINE  C-REACTIVE PROTEIN    EKG None  Radiology CT Chest W Contrast  Result Date: 06/27/2020 CLINICAL DATA:  Sepsis, concern for necrotizing fasciitis involving the anterior chest wall, painful, red, and malodorous cyst under the left breast, shortness of breath EXAM: CT CHEST WITH CONTRAST TECHNIQUE: Multidetector CT imaging of the chest was performed during intravenous contrast administration. CONTRAST:  3mL OMNIPAQUE IOHEXOL 300 MG/ML  SOLN COMPARISON:  None. FINDINGS: Cardiovascular: No significant vascular findings. Normal heart size. No pericardial effusion. Mediastinum/Nodes: No enlarged mediastinal, hilar, or axillary lymph nodes. Thyroid gland, trachea, and esophagus demonstrate no significant findings. Lungs/Pleura: Lungs are clear. No pleural effusion or pneumothorax. Upper Abdomen: No acute abnormality.  Hepatic steatosis. Musculoskeletal: No chest wall mass or suspicious bone lesions identified. There is a superficial subcutaneous air and fluid collection at the midline midline left inframammary fold measuring 3.6 x 3.0 x 1.4 cm (series 2, image 93, series 6, image 157). IMPRESSION: 1. There is a superficial subcutaneous air and fluid collection at the midline midline left inframammary fold measuring 3.6 cm, consistent with abscess. There are no specific imaging findings of necrotizing fasciitis on the current examination, however CT can significantly understate the extent of rapidly progressing fascial infection and surgical consultation should not be delayed if warranted by physical examination and clinical  suspicion. 2. Hepatic steatosis. Electronically Signed   By: Eddie Candle M.D.   On: 06/27/2020 19:16   DG Chest Port 1 View  Result Date: 06/27/2020 CLINICAL DATA:  Sepsis. EXAM: PORTABLE CHEST 1 VIEW COMPARISON:   None. FINDINGS: The cardiomediastinal contours are normal. The lungs are clear. Pulmonary vasculature is normal. No consolidation, pleural effusion, or pneumothorax. No acute osseous abnormalities are seen. IMPRESSION: Negative AP view of the chest. Electronically Signed   By: Keith Rake M.D.   On: 06/27/2020 18:36    Procedures Procedures (including critical care time)  Medications Ordered in ED Medications  piperacillin-tazobactam (ZOSYN) IVPB 3.375 g (3.375 g Intravenous New Bag/Given 06/27/20 2041)  diphenhydrAMINE (BENADRYL) capsule 50 mg (has no administration in time range)  0.9 %  sodium chloride infusion (1,000 mLs Intravenous New Bag/Given 06/27/20 1823)  acetaminophen (TYLENOL) tablet 650 mg (650 mg Oral Given 06/27/20 1749)  sodium chloride 0.9 % bolus 1,000 mL (0 mLs Intravenous Stopped 06/27/20 1955)  clindamycin (CLEOCIN) IVPB 600 mg (0 mg Intravenous Stopped 06/27/20 1929)  vancomycin (VANCOCIN) IVPB 1000 mg/200 mL premix ( Intravenous Stopped 06/27/20 2031)  iohexol (OMNIPAQUE) 300 MG/ML solution 100 mL (80 mLs Intravenous Contrast Given 06/27/20 1848)    ED Course  I have reviewed the triage vital signs and the nursing notes.  Pertinent labs & imaging results that were available during my care of the patient were reviewed by me and considered in my medical decision making (see chart for details).  Clinical Course as of 06/27/20 2107  Sat Jun 27, 2020  2020 Spoke with Dr. Donne Hazel, general surgery.  He has lower suspicion for necrotizing fasciitis at this time, but asked that she be transferred ED to ED to [facility] long and that he will see her there. [GG]  2027 Spoke with Dr. Marlowe Sax who will admit patient at [facility]. [GG]    Clinical Course User Index [GG] Corena Herter, PA-C   MDM Rules/Calculators/A&P                           Concern for early necrotizing fasciitis.  Will start patient on aggressive antibiotic therapy and IV fluids.  Laboratory work-up and  imaging ordered.  Labs CBC with differential: Leukocytosis to 14.9. CMP: Mild hypokalemia 3.3, otherwise no significant derangement. Urine pregnancy: Negative. CRP: Pending. Lactic acid: 3.4 >> Blood cultures: Pending. Resp panel PCR: Pending.  Spoke with Dr. Donne Hazel, general surgery.  He has lower suspicion for necrotizing fasciitis at this time, but he will see her when she is admitted to the hospitalist, likely in the AM after continued IV abx.    Spoke with Dr. Marlowe Sax who will admit patient at [facility].   Final Clinical Impression(s) / ED Diagnoses Final diagnoses:  SIRS (systemic inflammatory response syndrome) ([place])  Abscess    Rx / DC Orders ED Discharge Orders    None       Reita Chard 06/27/20 2107    Fredia Sorrow, MD 07/27/20 [phone]

## 2020-06-27 NOTE — ED Notes (Signed)
ED Provider at bedside with U/S to access wound

## 2020-06-27 NOTE — ED Notes (Signed)
Lactic Acid 3.4, results given to ED MD and PA, as well as Joss RN

## 2020-06-27 NOTE — Discharge Instructions (Addendum)
  Due to your suddenly worsening symptoms, it is recommended you go to the emergency department for further evaluation and treatment.  You have declined EMS transport. Please have your husband drive you directly to the emergency department for further evaluation and treatment.

## 2020-06-27 NOTE — ED Triage Notes (Signed)
Patient reports going to UC for a wound under her left breast and episodes of tachycardia, shaking/dizziness and Bp "bottoming out".

## 2020-06-27 NOTE — ED Notes (Addendum)
Green, PA made aware of pt's c/o pain returning, reports tenderness to chest wall. Wound margins marked. Pt medicated per order and given water and crackers with verbal approval from provider

## 2020-06-27 NOTE — ED Provider Notes (Signed)
[facility]    CSN: [id number] Arrival date & time: 06/27/20  1545      History   Chief Complaint Chief Complaint  Patient presents with  . Recurrent Skin Infections    Under LT breast    HPI Veronica Gay is a 29 y.o. female.   HPI Veronica Gay is a 29 y.o. female presenting to UC with c/o suddenly worsening pain, swelling and redness under her Left breast/Left upper abdomen for 2 days.  She has noticed a foul smelling odor but has not noticed any drainage.  Pt feels like her heart is racing at times and her blood pressure is going really high, then "bottoming out."  Hx of abscess in same area about 5-6 years ago, resolved with antibiotics.  She has had chills but no fever. No medication taken PTA.  Husband waiting in parking lot for pt.    Past Medical History:  Diagnosis Date  . ANEMIA, IRON DEFICIENCY 04/23/2010   Qualifier: Diagnosis of  By: Madilyn Fireman MD, Barnetta Chapel    . Anxiety   . Back pain   . CFS (chronic fatigue syndrome)   . Complicated migraine    last episode 2012  . Diabetes mellitus without complication ([place])   . Dyslipidemia 12/03/2018  . Fibromyalgia   . History of gestational diabetes   . Hypothyroidism   . IBS (irritable bowel syndrome)   . Joint pain   . Metabolic syndrome   . PCOS (polycystic ovarian syndrome)   . Vitamin D deficiency     Patient Active Problem List   Diagnosis Date Noted  . PCOS (polycystic ovarian syndrome) 12/25/2018  . Acne vulgaris 12/25/2018  . Hirsutism 12/25/2018  . Obesity due to endocrine disorder 12/25/2018  . Dysmenorrhea 12/25/2018  . Dyslipidemia 12/03/2018  . Class 1 obesity due to excess calories without serious comorbidity with body mass index (BMI) of 33.0 to 33.9 in adult 06/13/2018  . Chronic fatigue 02/21/2018  . Arthralgia of multiple joints 02/21/2018  . Generalized anxiety disorder 02/21/2018  . Hypothyroidism   . Complicated migraine   . History of gestational diabetes   . Vitamin D  insufficiency 04/23/2010  . Iron deficiency anemia due to chronic blood loss 04/23/2010    Past Surgical History:  Procedure Laterality Date  . CESAREAN SECTION    . TONSILLECTOMY      OB History    Gravida  2   Para  2   Term      Preterm      AB      Living  2     SAB      IAB      Ectopic      Multiple      Live Births               Home Medications    Prior to Admission medications   Medication Sig Start Date End Date Taking? Authorizing Provider  dicyclomine (BENTYL) 20 MG tablet Take 1 tablet (20 mg total) by mouth 2 (two) times daily as needed for spasms (abdominal cramping). 11/19/19   Muthersbaugh, Jarrett Soho, PA-C  doxycycline (VIBRA-TABS) 100 MG tablet TAKE 1 TABLET (100 MG TOTAL) BY MOUTH DAILY. 03/27/20   Emeterio Reeve, DO  doxycycline (VIBRAMYCIN) 100 MG capsule Take 100 mg by mouth daily.    [provider]  DULoxetine (CYMBALTA) 60 MG capsule Take 1 capsule (60 mg total) by mouth 2 (two) times daily. 03/26/20   Emeterio Reeve,  DO  ferrous sulfate 325 (65 FE) MG tablet Take 325 mg by mouth daily.    [provider]  levothyroxine (SYNTHROID) 50 MCG tablet Take 1 tablet (50 mcg total) by mouth daily before breakfast. Due for labs 03/26/20   Emeterio Reeve, DO  Multiple Vitamins-Minerals (CENTRUM ADULTS PO) Take 1 tablet by mouth daily.    [provider]  Norgestimate-Ethinyl Estradiol Triphasic 0.18/0.215/0.25 MG-25 MCG tab Take 1 tablet by mouth daily. 06/03/19   Renato Shin, MD  ondansetron (ZOFRAN ODT) 4 MG disintegrating tablet 5m ODT q4 hours prn nausea/vomit 11/19/19   Muthersbaugh, HJarrett Soho PA-C  pantoprazole (PROTONIX) 40 MG tablet Take 1 tablet (40 mg total) by mouth daily. 11/19/19   Muthersbaugh, HJarrett Soho PA-C  spironolactone (ALDACTONE) 100 MG tablet TAKE 1 TABLET BY MOUTH ONCE DAILY 06/24/20   ERenato Shin MD  sucralfate (CARAFATE) 1 g tablet Take 1 tablet (1 g total) by mouth 4 (four) times daily -  with  meals and at bedtime. 11/19/19   Muthersbaugh, HJarrett Soho PA-C  VITAMIN D PO Take 1 capsule by mouth daily. 5000iu daily    [provider]  Vitamin D, Ergocalciferol, (DRISDOL) 1.25 MG (50000 UNIT) CAPS capsule Take 1 capsule (50,000 Units total) by mouth every 7 (seven) days. 09/12/19   BStarlyn Skeans MD    Family History Family History  Problem Relation Age of Onset  . Lung cancer Other        grandfather  . Depression Other        mom,sister  . Diabetes Other        grandmother,grandfather  . Hypertension Other        father  . Thyroid disease Other        father  . Diabetes Mother   . Anxiety disorder Mother   . Depression Mother   . Sleep apnea Mother   . Eating disorder Mother   . Diabetes Father   . Fibromyalgia Father   . Multiple myeloma Father   . Anxiety disorder Father   . Depression Father   . Breast cancer Maternal Aunt   . Breast cancer Maternal Grandmother   . Lung cancer Maternal Grandfather   . Diabetes Paternal Grandfather   . Anxiety disorder Sister     Social History Social History   Tobacco Use  . Smoking status: Never Smoker  . Smokeless tobacco: Never Used  Vaping Use  . Vaping Use: Never used  Substance Use Topics  . Alcohol use: No  . Drug use: No     Allergies   Cephalosporins   Review of Systems Review of Systems  Constitutional: Positive for chills. Negative for fever.  Cardiovascular: Positive for palpitations. Negative for chest pain.  Gastrointestinal: Positive for nausea. Negative for diarrhea and vomiting.  Skin: Positive for color change. Negative for wound.  Neurological: Positive for dizziness and light-headedness. Negative for headaches.     Physical Exam Triage Vital Signs ED Triage Vitals  Enc Vitals Group     BP      Pulse      Resp      Temp      Temp src      SpO2      Weight      Height      Head Circumference      Peak Flow      Pain Score      Pain Loc      Pain Edu?  Excl. in [place]?     No data found.  Updated Vital Signs BP (!) 160/100 (BP Location: Right Arm)   Pulse (!) 124   Temp 98.4 F (36.9 C) (Oral)   Resp 18   SpO2 98%   Visual Acuity Right Eye Distance:   Left Eye Distance:   Bilateral Distance:    Right Eye Near:   Left Eye Near:    Bilateral Near:     Physical Exam Vitals and nursing note reviewed.  Constitutional:      Appearance: Normal appearance. She is well-developed and well-nourished. She is obese.     Comments: Appears mildly anxious but alert and cooperative during exam.   HENT:     Head: Normocephalic and atraumatic.  Eyes:     Extraocular Movements: EOM normal.  Cardiovascular:     Rate and Rhythm: Regular rhythm. Tachycardia present.  Pulmonary:     Effort: Pulmonary effort is normal. No respiratory distress.     Breath sounds: Normal breath sounds.  Chest:    Musculoskeletal:        General: Normal range of motion.     Cervical back: Normal range of motion.  Skin:    General: Skin is warm and dry.     Findings: Erythema present.  Neurological:     Mental Status: She is alert and oriented to person, place, and time.  Psychiatric:        Mood and Affect: Mood and affect normal.        Behavior: Behavior normal.      UC Treatments / Results  Labs (all labs ordered are listed, but only abnormal results are displayed) Labs Reviewed - No data to display  EKG   Radiology No results found.  Procedures Procedures (including critical care time)  Medications Ordered in UC Medications - No data to display  Initial Impression / Assessment and Plan / UC Course  I have reviewed the triage vital signs and the nursing notes.  Pertinent labs & imaging results that were available during my care of the patient were reviewed by me and considered in my medical decision making (see chart for details).     Pt tachycardic and reports sensation of her BP "bottoming out"  HR 124, BP 160/100. Concern for sepsis. Recommend  further evaluation and treatment in emergency department. Pt declined EMS transport, pt's husband drove her. Due to insurance, pt states she must stay within the [facility] system.  Pt understanding and agreeable to have her husband drive her directly to emergency department.  Final Clinical Impressions(s) / UC Diagnoses   Final diagnoses:  Cellulitis, abdominal wall     Discharge Instructions      Due to your suddenly worsening symptoms, it is recommended you go to the emergency department for further evaluation and treatment.  You have declined EMS transport. Please have your husband drive you directly to the emergency department for further evaluation and treatment.     ED Prescriptions    None     PDMP not reviewed this encounter.   Noe Gens, [place] 06/27/20 [phone]

## 2020-06-28 ENCOUNTER — Inpatient Hospital Stay (HOSPITAL_COMMUNITY): Payer: No Typology Code available for payment source | Admitting: Anesthesiology

## 2020-06-28 ENCOUNTER — Encounter (HOSPITAL_COMMUNITY): Payer: Self-pay | Admitting: Internal Medicine

## 2020-06-28 ENCOUNTER — Encounter (HOSPITAL_COMMUNITY)
Admission: EM | Disposition: A | Discharge status: Home / Self Care | Payer: Self-pay | Source: Home / Self Care | Attending: Internal Medicine

## 2020-06-28 DIAGNOSIS — Z881 Allergy status to other antibiotic agents status: Secondary | ICD-10-CM | POA: Diagnosis not present

## 2020-06-28 DIAGNOSIS — E876 Hypokalemia: Secondary | ICD-10-CM | POA: Diagnosis present

## 2020-06-28 DIAGNOSIS — Z79890 Hormone replacement therapy (postmenopausal): Secondary | ICD-10-CM | POA: Diagnosis not present

## 2020-06-28 DIAGNOSIS — E785 Hyperlipidemia, unspecified: Secondary | ICD-10-CM | POA: Diagnosis present

## 2020-06-28 DIAGNOSIS — M797 Fibromyalgia: Secondary | ICD-10-CM | POA: Diagnosis present

## 2020-06-28 DIAGNOSIS — E119 Type 2 diabetes mellitus without complications: Secondary | ICD-10-CM

## 2020-06-28 DIAGNOSIS — E039 Hypothyroidism, unspecified: Secondary | ICD-10-CM | POA: Diagnosis present

## 2020-06-28 DIAGNOSIS — E282 Polycystic ovarian syndrome: Secondary | ICD-10-CM | POA: Diagnosis present

## 2020-06-28 DIAGNOSIS — A419 Sepsis, unspecified organism: Principal | ICD-10-CM

## 2020-06-28 DIAGNOSIS — Z801 Family history of malignant neoplasm of trachea, bronchus and lung: Secondary | ICD-10-CM | POA: Diagnosis not present

## 2020-06-28 DIAGNOSIS — Z803 Family history of malignant neoplasm of breast: Secondary | ICD-10-CM | POA: Diagnosis not present

## 2020-06-28 DIAGNOSIS — R5382 Chronic fatigue, unspecified: Secondary | ICD-10-CM | POA: Diagnosis present

## 2020-06-28 DIAGNOSIS — E669 Obesity, unspecified: Secondary | ICD-10-CM | POA: Diagnosis present

## 2020-06-28 DIAGNOSIS — Z818 Family history of other mental and behavioral disorders: Secondary | ICD-10-CM | POA: Diagnosis not present

## 2020-06-28 DIAGNOSIS — L0390 Cellulitis, unspecified: Secondary | ICD-10-CM

## 2020-06-28 DIAGNOSIS — Z79899 Other long term (current) drug therapy: Secondary | ICD-10-CM | POA: Diagnosis not present

## 2020-06-28 DIAGNOSIS — Z833 Family history of diabetes mellitus: Secondary | ICD-10-CM | POA: Diagnosis not present

## 2020-06-28 DIAGNOSIS — Z8632 Personal history of gestational diabetes: Secondary | ICD-10-CM | POA: Diagnosis not present

## 2020-06-28 DIAGNOSIS — L02211 Cutaneous abscess of abdominal wall: Secondary | ICD-10-CM | POA: Diagnosis present

## 2020-06-28 DIAGNOSIS — L0291 Cutaneous abscess, unspecified: Secondary | ICD-10-CM | POA: Diagnosis present

## 2020-06-28 DIAGNOSIS — D509 Iron deficiency anemia, unspecified: Secondary | ICD-10-CM | POA: Diagnosis present

## 2020-06-28 DIAGNOSIS — Z20822 Contact with and (suspected) exposure to covid-19: Secondary | ICD-10-CM | POA: Diagnosis present

## 2020-06-28 DIAGNOSIS — Z6841 Body Mass Index (BMI) 40.0 and over, adult: Secondary | ICD-10-CM | POA: Diagnosis not present

## 2020-06-28 DIAGNOSIS — Z807 Family history of other malignant neoplasms of lymphoid, hematopoietic and related tissues: Secondary | ICD-10-CM | POA: Diagnosis not present

## 2020-06-28 HISTORY — PX: BREAST LUMPECTOMY: SHX2

## 2020-06-28 LAB — CBC WITH DIFFERENTIAL/PLATELET
Abs Immature Granulocytes: 0.08 10*3/uL — ABNORMAL HIGH (ref 0.00–0.07)
Basophils Absolute: 0.1 10*3/uL (ref 0.0–0.1)
Basophils Relative: 1 %
Eosinophils Absolute: 0.6 10*3/uL — ABNORMAL HIGH (ref 0.0–0.5)
Eosinophils Relative: 6 %
HCT: 35.1 % — ABNORMAL LOW (ref 36.0–46.0)
Hemoglobin: 11.6 g/dL — ABNORMAL LOW (ref 12.0–15.0)
Immature Granulocytes: 1 %
Lymphocytes Relative: 25 %
Lymphs Abs: 2.6 10*3/uL (ref 0.7–4.0)
MCH: 27.9 pg (ref 26.0–34.0)
MCHC: 33 g/dL (ref 30.0–36.0)
MCV: 84.4 fL (ref 80.0–100.0)
Monocytes Absolute: 0.8 10*3/uL (ref 0.1–1.0)
Monocytes Relative: 8 %
Neutro Abs: 6.2 10*3/uL (ref 1.7–7.7)
Neutrophils Relative %: 59 %
Platelets: 281 10*3/uL (ref 150–400)
RBC: 4.16 MIL/uL (ref 3.87–5.11)
RDW: 13.2 % (ref 11.5–15.5)
WBC: 10.3 10*3/uL (ref 4.0–10.5)
nRBC: 0 % (ref 0.0–0.2)

## 2020-06-28 LAB — MAGNESIUM: Magnesium: 2.1 mg/dL (ref 1.7–2.4)

## 2020-06-28 LAB — BASIC METABOLIC PANEL
Anion gap: 10 (ref 5–15)
BUN: 10 mg/dL (ref 6–20)
CO2: 24 mmol/L (ref 22–32)
Calcium: 8.6 mg/dL — ABNORMAL LOW (ref 8.9–10.3)
Chloride: 103 mmol/L (ref 98–111)
Creatinine, Ser: 0.6 mg/dL (ref 0.44–1.00)
GFR, Estimated: >60 mL/min (ref >60)
Glucose, Bld: 127 mg/dL — ABNORMAL HIGH (ref 70–99)
Potassium: 3.7 mmol/L (ref 3.5–5.1)
Sodium: 137 mmol/L (ref 135–145)

## 2020-06-28 LAB — HEMOGLOBIN A1C
Hgb A1c MFr Bld: 5.8 % — ABNORMAL HIGH (ref 4.8–5.6)
Mean Plasma Glucose: 119.76 mg/dL

## 2020-06-28 LAB — GLUCOSE, CAPILLARY
Glucose-Capillary: 105 mg/dL — ABNORMAL HIGH (ref 70–99)
Glucose-Capillary: 105 mg/dL — ABNORMAL HIGH (ref 70–99)

## 2020-06-28 LAB — PHOSPHORUS: Phosphorus: 3.9 mg/dL (ref 2.5–4.6)

## 2020-06-28 LAB — PROTIME-INR
INR: 1.1 (ref 0.8–1.2)
Prothrombin Time: 13.4 seconds (ref 11.4–15.2)

## 2020-06-28 LAB — APTT: aPTT: 26 seconds (ref 24–36)

## 2020-06-28 LAB — HIV ANTIBODY (ROUTINE TESTING W REFLEX): HIV Screen 4th Generation wRfx: NONREACTIVE

## 2020-06-28 SURGERY — BREAST LUMPECTOMY
Anesthesia: General | Site: Abdomen | Laterality: Left

## 2020-06-28 MED ORDER — ENOXAPARIN SODIUM 40 MG/0.4ML ~~LOC~~ SOLN: 40.0000 mg | SUBCUTANEOUS | Status: DC

## 2020-06-28 MED ORDER — LIDOCAINE HCL (PF) 2 % IJ SOLN
INTRAMUSCULAR | Status: AC
  Filled 2020-06-28: qty 5

## 2020-06-28 MED ORDER — ACETAMINOPHEN 650 MG RE SUPP: 650.0000 mg | Freq: Four times a day (QID) | RECTAL | Status: DC | PRN

## 2020-06-28 MED ORDER — PROPOFOL 10 MG/ML IV BOLUS
INTRAVENOUS | Status: DC | PRN
  Administered 2020-06-28: 50 mg via INTRAVENOUS
  Administered 2020-06-28: 200 mg via INTRAVENOUS
  Administered 2020-06-28: 50 mg via INTRAVENOUS

## 2020-06-28 MED ORDER — DULOXETINE HCL 60 MG PO CPEP: 60.0000 mg | ORAL_CAPSULE | Freq: Two times a day (BID) | ORAL | Status: DC

## 2020-06-28 MED ORDER — SODIUM CHLORIDE 0.9 % IV SOLN: INTRAVENOUS | Status: DC | PRN

## 2020-06-28 MED ORDER — DEXAMETHASONE SODIUM PHOSPHATE 10 MG/ML IJ SOLN
INTRAMUSCULAR | Status: AC
  Filled 2020-06-28: qty 1

## 2020-06-28 MED ORDER — KETOROLAC TROMETHAMINE 30 MG/ML IJ SOLN
30.0000 mg | Freq: Once | INTRAMUSCULAR | Status: AC
  Administered 2020-06-28: 09:00:00 30 mg via INTRAVENOUS
  Filled 2020-06-28: qty 1

## 2020-06-28 MED ORDER — ENOXAPARIN SODIUM 40 MG/0.4ML ~~LOC~~ SOLN
40.0000 mg | SUBCUTANEOUS | Status: DC
  Administered 2020-06-29: 08:00:00 40 mg via SUBCUTANEOUS
  Filled 2020-06-28: qty 0.4

## 2020-06-28 MED ORDER — PIPERACILLIN-TAZOBACTAM 3.375 G IVPB
3.3750 g | Freq: Three times a day (TID) | INTRAVENOUS | Status: DC
  Administered 2020-06-28 – 2020-06-29 (×4): 3.375 g via INTRAVENOUS
  Filled 2020-06-28 (×4): qty 50

## 2020-06-28 MED ORDER — PROPOFOL 10 MG/ML IV BOLUS
INTRAVENOUS | Status: AC
  Filled 2020-06-28: qty 20

## 2020-06-28 MED ORDER — SODIUM CHLORIDE 0.9 % IV SOLN: 2.0000 g | Freq: Once | INTRAVENOUS | Status: DC

## 2020-06-28 MED ORDER — KETOROLAC TROMETHAMINE 30 MG/ML IJ SOLN: 30.0000 mg | Freq: Four times a day (QID) | INTRAMUSCULAR | Status: DC | PRN

## 2020-06-28 MED ORDER — DULOXETINE HCL 30 MG PO CPEP
30.0000 mg | ORAL_CAPSULE | Freq: Two times a day (BID) | ORAL | Status: DC
  Administered 2020-06-28 – 2020-06-29 (×2): 30 mg via ORAL
  Filled 2020-06-28 (×2): qty 1

## 2020-06-28 MED ORDER — POTASSIUM CHLORIDE IN NACL 20-0.9 MEQ/L-% IV SOLN
INTRAVENOUS | Status: AC
  Filled 2020-06-28 (×5): qty 1000

## 2020-06-28 MED ORDER — LIDOCAINE 2% (20 MG/ML) 5 ML SYRINGE
INTRAMUSCULAR | Status: DC | PRN
  Administered 2020-06-28: 100 mg via INTRAVENOUS

## 2020-06-28 MED ORDER — FENTANYL CITRATE (PF) 100 MCG/2ML IJ SOLN
INTRAMUSCULAR | Status: AC
  Filled 2020-06-28: qty 2

## 2020-06-28 MED ORDER — MIDAZOLAM HCL 5 MG/5ML IJ SOLN
INTRAMUSCULAR | Status: DC | PRN
  Administered 2020-06-28: 2 mg via INTRAVENOUS

## 2020-06-28 MED ORDER — PROCHLORPERAZINE EDISYLATE 10 MG/2ML IJ SOLN
5.0000 mg | INTRAMUSCULAR | Status: DC | PRN
  Administered 2020-06-29: 02:00:00 5 mg via INTRAVENOUS
  Filled 2020-06-28: qty 2

## 2020-06-28 MED ORDER — LEVOTHYROXINE SODIUM 50 MCG PO TABS
50.0000 ug | ORAL_TABLET | Freq: Every day | ORAL | Status: DC
  Administered 2020-06-29: 05:00:00 50 ug via ORAL
  Filled 2020-06-28: qty 1

## 2020-06-28 MED ORDER — ONDANSETRON HCL 4 MG/2ML IJ SOLN
INTRAMUSCULAR | Status: AC
  Filled 2020-06-28: qty 2

## 2020-06-28 MED ORDER — 0.9 % SODIUM CHLORIDE (POUR BTL) OPTIME
TOPICAL | Status: DC | PRN
  Administered 2020-06-28: 10:00:00 1000 mL

## 2020-06-28 MED ORDER — OXYCODONE HCL 5 MG PO TABS: 5.0000 mg | ORAL_TABLET | Freq: Four times a day (QID) | ORAL | Status: DC | PRN

## 2020-06-28 MED ORDER — KETOROLAC TROMETHAMINE 30 MG/ML IJ SOLN
30.0000 mg | Freq: Four times a day (QID) | INTRAMUSCULAR | Status: DC | PRN
  Administered 2020-06-28 – 2020-06-29 (×3): 30 mg via INTRAVENOUS
  Filled 2020-06-28 (×3): qty 1

## 2020-06-28 MED ORDER — BUPIVACAINE-EPINEPHRINE 0.25% -1:200000 IJ SOLN
INTRAMUSCULAR | Status: DC | PRN
  Administered 2020-06-28: 10 mL

## 2020-06-28 MED ORDER — ACETAMINOPHEN 325 MG PO TABS
650.0000 mg | ORAL_TABLET | Freq: Once | ORAL | Status: AC
  Administered 2020-06-28: 05:00:00 650 mg via ORAL
  Filled 2020-06-28: qty 2

## 2020-06-28 MED ORDER — ONDANSETRON HCL 4 MG/2ML IJ SOLN
INTRAMUSCULAR | Status: DC | PRN
  Administered 2020-06-28: 4 mg via INTRAVENOUS

## 2020-06-28 MED ORDER — HYDROMORPHONE HCL 1 MG/ML IJ SOLN: 0.2500 mg | INTRAMUSCULAR | Status: DC | PRN

## 2020-06-28 MED ORDER — MIDAZOLAM HCL 2 MG/2ML IJ SOLN
INTRAMUSCULAR | Status: AC
  Filled 2020-06-28: qty 2

## 2020-06-28 MED ORDER — BUPIVACAINE-EPINEPHRINE (PF) 0.25% -1:200000 IJ SOLN
INTRAMUSCULAR | Status: AC
  Filled 2020-06-28: qty 30

## 2020-06-28 MED ORDER — VANCOMYCIN HCL IN DEXTROSE 1-5 GM/200ML-% IV SOLN
1000.0000 mg | Freq: Two times a day (BID) | INTRAVENOUS | Status: DC
  Administered 2020-06-28 – 2020-06-29 (×3): 1000 mg via INTRAVENOUS
  Filled 2020-06-28 (×3): qty 200

## 2020-06-28 MED ORDER — FENTANYL CITRATE (PF) 100 MCG/2ML IJ SOLN
INTRAMUSCULAR | Status: DC | PRN
  Administered 2020-06-28 (×2): 50 ug via INTRAVENOUS

## 2020-06-28 MED ORDER — LACTATED RINGERS IV SOLN: INTRAVENOUS | Status: DC | PRN

## 2020-06-28 MED ORDER — ACETAMINOPHEN 325 MG PO TABS: 650.0000 mg | ORAL_TABLET | Freq: Four times a day (QID) | ORAL | Status: DC | PRN

## 2020-06-28 MED ORDER — OXYCODONE HCL 5 MG PO TABS
5.0000 mg | ORAL_TABLET | Freq: Four times a day (QID) | ORAL | Status: DC | PRN
  Administered 2020-06-28 (×2): 5 mg via ORAL
  Filled 2020-06-28 (×3): qty 1

## 2020-06-28 SURGICAL SUPPLY — 42 items
BLADE HEX COATED 2.75 (ELECTRODE) ×3 IMPLANT
BLADE SURG 15 STRL LF DISP TIS (BLADE) ×3 IMPLANT
BLADE SURG 15 STRL SS (BLADE) ×6
CHLORAPREP W/TINT 26 (MISCELLANEOUS) IMPLANT
CLOSURE WOUND 1/2 X4 (GAUZE/BANDAGES/DRESSINGS)
COVER SURGICAL LIGHT HANDLE (MISCELLANEOUS) ×3 IMPLANT
COVER WAND RF STERILE (DRAPES) IMPLANT
DECANTER SPIKE VIAL GLASS SM (MISCELLANEOUS) ×3 IMPLANT
DERMABOND ADVANCED (GAUZE/BANDAGES/DRESSINGS)
DERMABOND ADVANCED .7 DNX12 (GAUZE/BANDAGES/DRESSINGS) IMPLANT
DRAPE LAPAROSCOPIC ABDOMINAL (DRAPES) IMPLANT
DRSG PAD ABDOMINAL 8X10 ST (GAUZE/BANDAGES/DRESSINGS) ×3 IMPLANT
ELECT REM PT RETURN 15FT ADLT (MISCELLANEOUS) ×3 IMPLANT
GAUZE 4X4 16PLY RFD (DISPOSABLE) ×3 IMPLANT
GAUZE PACKING IODOFORM 1/2 (PACKING) ×3 IMPLANT
GAUZE SPONGE 4X4 12PLY STRL (GAUZE/BANDAGES/DRESSINGS) ×3 IMPLANT
GLOVE BIO SURGEON STRL SZ7 (GLOVE) ×3 IMPLANT
GLOVE BIOGEL PI IND STRL 7.0 (GLOVE) ×1 IMPLANT
GLOVE BIOGEL PI IND STRL 7.5 (GLOVE) ×1 IMPLANT
GLOVE BIOGEL PI INDICATOR 7.0 (GLOVE) ×2
GLOVE BIOGEL PI INDICATOR 7.5 (GLOVE) ×2
GOWN STRL REUS W/ TWL XL LVL3 (GOWN DISPOSABLE) ×1 IMPLANT
GOWN STRL REUS W/TWL LRG LVL3 (GOWN DISPOSABLE) ×6 IMPLANT
GOWN STRL REUS W/TWL XL LVL3 (GOWN DISPOSABLE) ×5 IMPLANT
KIT BASIN OR (CUSTOM PROCEDURE TRAY) ×3 IMPLANT
KIT TURNOVER KIT A (KITS) IMPLANT
MARKER SKIN DUAL TIP RULER LAB (MISCELLANEOUS) ×3 IMPLANT
NEEDLE HYPO 22GX1.5 SAFETY (NEEDLE) IMPLANT
NEEDLE HYPO 25X1 1.5 SAFETY (NEEDLE) IMPLANT
PACK BASIC VI WITH GOWN DISP (CUSTOM PROCEDURE TRAY) ×3 IMPLANT
PENCIL SMOKE EVACUATOR (MISCELLANEOUS) IMPLANT
STRIP CLOSURE SKIN 1/2X4 (GAUZE/BANDAGES/DRESSINGS) IMPLANT
SUT MNCRL AB 4-0 PS2 18 (SUTURE) ×3 IMPLANT
SUT VIC AB 2-0 SH 27 (SUTURE) ×2
SUT VIC AB 2-0 SH 27X BRD (SUTURE) ×1 IMPLANT
SUT VIC AB 3-0 SH 27 (SUTURE)
SUT VIC AB 3-0 SH 27XBRD (SUTURE) IMPLANT
SYR BULB IRRIG 60ML STRL (SYRINGE) IMPLANT
SYR CONTROL 10ML LL (SYRINGE) IMPLANT
TAPE CLOTH SURG 6X10 WHT LF (GAUZE/BANDAGES/DRESSINGS) ×3 IMPLANT
TOWEL OR 17X26 10 PK STRL BLUE (TOWEL DISPOSABLE) ×3 IMPLANT
TOWEL OR NON WOVEN STRL DISP B (DISPOSABLE) ×3 IMPLANT

## 2020-06-28 NOTE — Progress Notes (Signed)
Pharmacy Antibiotic Note  Veronica Gay is a 29 y.o. female admitted on 06/27/2020 with cellulitis.  Pharmacy has been consulted for Vancomycin & Cefepime dosing. Patient received Vancomycin/Zosyn/Clindamycin at [facility], verified allergy to Cephalosporins is Hives and updated allergy information.  Use Zosyn in place of Cefepime per MD.   Plan: Zosyn 3.375g IV q8h (4 hour infusion).  Vancomycin 1gm in ED, then continue 1gm q12   Height: 5\' 3"  (160 cm) Weight: 97.5 kg (215 lb) IBW/kg (Calculated) : 52.4  Temp (24hrs), Avg:98.9 F (37.2 C), Min:97.9 F (36.6 C), Max:101.6 F (38.7 C)  Recent Labs  Lab 06/27/20 1743 06/27/20 1745 06/27/20 1958  WBC 14.9*  --   --   CREATININE 0.74  --   --   LATICACIDVEN  --  3.4* 1.1    Estimated Creatinine Clearance: 113.2 mL/min (by C-G formula based on SCr of 0.74 mg/dL).    Allergies  Allergen Reactions  . Cephalosporins     Antimicrobials this admission: 12/11 Clindamycin x1 12/11 Zosyn >>  12/11 Vancomycin >>   Dose adjustments this admission:  Microbiology results: 12/11 BCx: sent  Thank you for allowing pharmacy to be a part of this patient's care.  14/11 PharmD 06/28/2020 6:15 AM

## 2020-06-28 NOTE — Anesthesia Procedure Notes (Addendum)
Procedure Name: LMA Insertion Date/Time: 06/28/2020 10:12 AM Performed by: Illene Silver, CRNA Pre-anesthesia Checklist: Patient identified, Emergency Drugs available, Suction available and Patient being monitored Patient Re-evaluated:Patient Re-evaluated prior to induction Oxygen Delivery Method: Circle system utilized Preoxygenation: Pre-oxygenation with 100% oxygen Induction Type: IV induction Ventilation: Oral airway inserted - appropriate to patient size and Mask ventilation with difficulty LMA: LMA with gastric port inserted LMA Size: 4.0 Tube type: Oral Number of attempts: 1 Airway Equipment and Method: Oral airway Placement Confirmation: positive ETCO2 Tube secured with: Tape Dental Injury: Teeth and Oropharynx as per pre-operative assessment  Difficulty Due To: Difficult Airway- due to large tongue and Difficult Airway- due to limited oral opening Comments: Very small mouth MAP 3, difficult to ventilate without airway and very small mouth

## 2020-06-28 NOTE — Anesthesia Preprocedure Evaluation (Addendum)
Anesthesia Evaluation  Patient identified by MRN, date of birth, ID band Patient awake    Reviewed: Allergy & Precautions, H&P , NPO status , Patient's Chart, lab work & pertinent test results  Airway Mallampati: III  TM Distance: >3 FB Neck ROM: Full    Dental no notable dental hx. (+) Teeth Intact, Dental Advisory Given   Pulmonary neg pulmonary ROS,    Pulmonary exam normal breath sounds clear to auscultation       Cardiovascular negative cardio ROS   Rhythm:Regular Rate:Normal     Neuro/Psych  Headaches, Anxiety    GI/Hepatic negative GI ROS, Neg liver ROS,   Endo/Other  diabetesHypothyroidism Morbid obesity  Renal/GU negative Renal ROS  negative genitourinary   Musculoskeletal  (+) Fibromyalgia -  Abdominal   Peds  Hematology  (+) Blood dyscrasia, anemia ,   Anesthesia Other Findings   Reproductive/Obstetrics negative OB ROS                            Anesthesia Physical Anesthesia Plan  ASA: II  Anesthesia Plan: General   Post-op Pain Management:    Induction: Intravenous  PONV Risk Score and Plan: 4 or greater and Ondansetron, Dexamethasone and Midazolam  Airway Management Planned: LMA  Additional Equipment:   Intra-op Plan:   Post-operative Plan: Extubation in OR  Informed Consent: I have reviewed the patients History and Physical, chart, labs and discussed the procedure including the risks, benefits and alternatives for the proposed anesthesia with the patient or authorized representative who has indicated his/her understanding and acceptance.     Dental advisory given  Plan Discussed with: CRNA  Anesthesia Plan Comments:        Anesthesia Quick Evaluation

## 2020-06-28 NOTE — Progress Notes (Signed)
   06/28/20 0559  Charting Type  Charting Type Initial  Assessment of needs addressed Yes  [place] Work Intensity Score (Update with each assessment and as needed)  Work Intensity Score (Level) 2  Level 1 Intensity C.Stable with routine assessments;B.Medication scheduled or prn every 6 hours  Neurological  Neuro (WDL) WDL  Glasgow Coma Scale  Eye Opening 4  Best Verbal Response (NON-intubated) 5  Best Motor Response 6  Glasgow Coma Scale Score 15  NuDESC - Delirium Risk Factor Assessment (Complete for non-ICU patients)  Delirium Risk Factor Assessment No risk factors identified - Delirium assessment complete  HEENT  HEENT (WDL) WDL  Respiratory  Respiratory (WDL) WDL  Cardiac  Cardiac (WDL) WDL  ECG Monitor Yes  Vascular  Vascular (WDL) WDL  Integumentary  Integumentary (WDL) X  RN Assisting with Skin Assessment on Admission Robin, RN  Skin Color Appropriate for ethnicity  Braden Scale (Ages 8 and up)  Sensory Perceptions 4  Moisture 4  Activity 4  Mobility 4  Nutrition 4  Friction and Shear 3  Braden Scale Score 23  Musculoskeletal  Musculoskeletal (WDL) WDL  Gastrointestinal  Gastrointestinal (WDL) WDL  Last BM Date 06/27/20  GU Assessment  Genitourinary (WDL) WDL  Psychosocial  Psychosocial (WDL) WDL  Wound / Incision (Open or Dehisced) 06/27/20  Chest  Date First Assessed/Time First Assessed: 06/27/20 1751   Wound Type: (c)   Location: Chest  Present on Admission: Yes  Dressing Type None  Peri-wound Assessment Erythema (blanchable)  Wound Length (cm) 9 cm  Wound Width (cm) 4 cm  Wound Surface Area (cm^2) 36 cm^2  Closure None  Neurological  Level of Consciousness Alert  Pt arrived to room 1441. Pt situated and put on telemetry. Ht. And Wt completed. Oriented to room.  No acute distress noted.

## 2020-06-28 NOTE — Progress Notes (Signed)
Patient seen and examined this morning, admitted overnight, H&P reviewed and agree with the assessment and plan.  29 year old female with iron deficiency anemia, DM 2, PCOS, obesity with BMI 41 here with pain underneath her left breast found to have an abscess.  Discussed with Dr. Dwain Sarna with general surgery, he will take her to the OR for I&D.  Meanwhile maintain on antibiotics and hopefully culture data will guide final antibiotic selection  Veronica Amsden M. Elvera Lennox, MD, PhD [facility]  Between 7 am - 7 pm you can contact me via Amion or Securechat.  I am not available 7 pm - 7 am, please contact night coverage MD/APP via Amion

## 2020-06-28 NOTE — H&P (Signed)
History and Physical    Veronica Gay:[id number] DOB: August 29, 1990 DOA: 06/27/2020  PCP: Emeterio Reeve, DO   Patient coming from: Home.  I have personally briefly reviewed patient's old medical records in [place]  Chief Complaint: Left breast abscess.  HPI: Veronica Gay is a 29 y.o. female with medical history significant of history of iron deficiency anemia, anxiety, back pain, chronic fatigue syndrome, fibromyalgia, history of complicated migraine, type 2 diabetes, hyperlipidemia, hypothyroidism, IBS, PCOS, vitamin D deficiency who is coming to the emergency department due to progressively worse left breast pain since Friday associated with headache, fatigue, malaise, nausea, palpitations, transient dyspnea and transient chest pressure.  She had a similar infection about 6 years ago.  She denies rhinorrhea, sore throat, wheezing or hemoptysis.  No PND, orthopnea or pitting edema of the lower extremities.  Denies abdominal pain, emesis, diarrhea, constipation, melena or hematochezia.  No dysuria, frequency or hematuria.  Denies polyuria, polydipsia, polyphagia or blurred vision.  ED Course: Initial vital signs were temperature 101.6 F, pulse 138, respirations 24, BP 155/112 mmHg O2 sat 98% on room air.  The patient received IV fluids, Toradol 30 mg IVP x1 dose, vancomycin and Zosyn.  Imaging: A one-view portable chest radiograph did not show any acute cardiopulmonary pathology.  CT chest with contrast shows a superficial subcutaneous air and fluid collection of the midline left inflammatory fold measuring 3.6 cm consistent with an abscess.  There is no specific findings suggesting necrotizing fasciitis.  Please see images and full radiology report for further detail.  Review of Systems: As per HPI otherwise all other systems reviewed and are negative.  Past Medical History:  Diagnosis Date  . ANEMIA, IRON DEFICIENCY 04/23/2010   Qualifier: Diagnosis of  By: Madilyn Fireman MD,  Barnetta Chapel    . Anxiety   . Back pain   . CFS (chronic fatigue syndrome)   . Complicated migraine    last episode 2012  . Diabetes mellitus without complication ([place])   . Dyslipidemia 12/03/2018  . Fibromyalgia   . History of gestational diabetes   . Hypothyroidism   . IBS (irritable bowel syndrome)   . Joint pain   . Metabolic syndrome   . PCOS (polycystic ovarian syndrome)   . Vitamin D deficiency    Past Surgical History:  Procedure Laterality Date  . CESAREAN SECTION    . TONSILLECTOMY     Social History  reports that she has never smoked. She has never used smokeless tobacco. She reports that she does not drink alcohol and does not use drugs.  Allergies  Allergen Reactions  . Cephalosporins Hives    Reaction info from RN who received the patient 12/12.     Family History  Problem Relation Age of Onset  . Lung cancer Other        grandfather  . Depression Other        mom,sister  . Diabetes Other        grandmother,grandfather  . Hypertension Other        father  . Thyroid disease Other        father  . Diabetes Mother   . Anxiety disorder Mother   . Depression Mother   . Sleep apnea Mother   . Eating disorder Mother   . Diabetes Father   . Fibromyalgia Father   . Multiple myeloma Father   . Anxiety disorder Father   . Depression Father   . Breast cancer Maternal Aunt   .  Breast cancer Maternal Grandmother   . Lung cancer Maternal Grandfather   . Diabetes Paternal Grandfather   . Anxiety disorder Sister    Prior to Admission medications   Medication Sig Start Date End Date Taking? Authorizing Provider  dicyclomine (BENTYL) 20 MG tablet Take 1 tablet (20 mg total) by mouth 2 (two) times daily as needed for spasms (abdominal cramping). 11/19/19   Muthersbaugh, Jarrett Soho, PA-C  doxycycline (VIBRA-TABS) 100 MG tablet TAKE 1 TABLET (100 MG TOTAL) BY MOUTH DAILY. 03/27/20   Emeterio Reeve, DO  doxycycline (VIBRAMYCIN) 100 MG capsule Take 100 mg by mouth daily.     [provider]  DULoxetine (CYMBALTA) 60 MG capsule Take 1 capsule (60 mg total) by mouth 2 (two) times daily. 03/26/20   Emeterio Reeve, DO  ferrous sulfate 325 (65 FE) MG tablet Take 325 mg by mouth daily.    [provider]  levothyroxine (SYNTHROID) 50 MCG tablet Take 1 tablet (50 mcg total) by mouth daily before breakfast. Due for labs 03/26/20   Emeterio Reeve, DO  Multiple Vitamins-Minerals (CENTRUM ADULTS PO) Take 1 tablet by mouth daily.    [provider]  Norgestimate-Ethinyl Estradiol Triphasic 0.18/0.215/0.25 MG-25 MCG tab Take 1 tablet by mouth daily. 06/03/19   Renato Shin, MD  ondansetron (ZOFRAN ODT) 4 MG disintegrating tablet 33m ODT q4 hours prn nausea/vomit 11/19/19   Muthersbaugh, HJarrett Soho PA-C  pantoprazole (PROTONIX) 40 MG tablet Take 1 tablet (40 mg total) by mouth daily. 11/19/19   Muthersbaugh, HJarrett Soho PA-C  spironolactone (ALDACTONE) 100 MG tablet TAKE 1 TABLET BY MOUTH ONCE DAILY 06/24/20   ERenato Shin MD  sucralfate (CARAFATE) 1 g tablet Take 1 tablet (1 g total) by mouth 4 (four) times daily -  with meals and at bedtime. 11/19/19   Muthersbaugh, HJarrett Soho PA-C  VITAMIN D PO Take 1 capsule by mouth daily. 5000iu daily    [provider]  Vitamin D, Ergocalciferol, (DRISDOL) 1.25 MG (50000 UNIT) CAPS capsule Take 1 capsule (50,000 Units total) by mouth every 7 (seven) days. 09/12/19   BDennard NipD, MD    Physical Exam: Vitals:   06/28/20 0400 06/28/20 0430 06/28/20 0453 06/28/20 0553  BP: 125/75 (!) 138/93 125/87 125/85  Pulse: 92 89 82 84  Resp: 13 19 14 20   Temp:  98.3 F (36.8 C)  97.9 F (36.6 C)  TempSrc:  Oral  Oral  SpO2: 96% 98% 98% 98%  Weight:      Height:        Constitutional: NAD, calm, comfortable Eyes: PERRL, lids and conjunctivae normal ENMT: Mucous membranes are moist. Posterior pharynx clear of any exudate or lesions. Neck: normal, supple, no masses, no thyromegaly Respiratory: clear to  auscultation bilaterally, no wheezing, no crackles. Normal respiratory effort. No accessory muscle use.  Cardiovascular: Regular rate and rhythm, no murmurs / rubs / gallops. No extremity edema. 2+ pedal pulses. No carotid bruits.  Abdomen: Obese, no distention.  Bowel sounds positive.  Soft, no tenderness, no masses palpated.  Musculoskeletal: no clubbing / cyanosis. No joint deformity upper and lower extremities. Good ROM, no contractures. Normal muscle tone.  Skin: Left breast inferior area abscess with surrounding erythema, edema, calor and TTP.  Please see pictures below. Neurologic: CN 2-12 grossly intact. Sensation intact, DTR normal. Strength 5/5 in all 4.  Psychiatric: Normal judgment and insight. Alert and oriented x 3. Normal mood.        Labs on Admission: I have personally reviewed following labs and imaging  studies  CBC: Recent Labs  Lab 06/27/20 1743 06/28/20 0636  WBC 14.9* 10.3  NEUTROABS 10.4* 6.2  HGB 12.5 11.6*  HCT 36.3 35.1*  MCV 81.4 84.4  PLT 330 935    Basic Metabolic Panel: Recent Labs  Lab 06/27/20 1743  NA 135  K 3.3*  CL 100  CO2 21*  GLUCOSE 148*  BUN 11  CREATININE 0.74  CALCIUM 9.1    GFR: Estimated Creatinine Clearance: 113.2 mL/min (by C-G formula based on SCr of 0.74 mg/dL).  Liver Function Tests: Recent Labs  Lab 06/27/20 1743  AST 26  ALT 22  ALKPHOS 43  BILITOT 0.3  PROT 7.3  ALBUMIN 4.0    Urine analysis:    Component Value Date/Time   COLORURINE YELLOW 06/27/2020 [place] 06/27/2020 1715   LABSPEC 1.015 06/27/2020 1715   PHURINE 7.0 06/27/2020 1715   GLUCOSEU NEGATIVE 06/27/2020 1715   HGBUR NEGATIVE 06/27/2020 [place] 06/27/2020 1715   BILIRUBINUR negative 10/21/2010 1024   KETONESUR NEGATIVE 06/27/2020 1715   PROTEINUR NEGATIVE 06/27/2020 1715   UROBILINOGEN 0.2 10/21/2010 1024   NITRITE NEGATIVE 06/27/2020 [place] 06/27/2020 1715     Radiological Exams on Admission: CT Chest W Contrast  Result Date: 06/27/2020 CLINICAL DATA:  Sepsis, concern for necrotizing fasciitis involving the anterior chest wall, painful, red, and malodorous cyst under the left breast, shortness of breath EXAM: CT CHEST WITH CONTRAST TECHNIQUE: Multidetector CT imaging of the chest was performed during intravenous contrast administration. CONTRAST:  83m OMNIPAQUE IOHEXOL 300 MG/ML  SOLN COMPARISON:  None. FINDINGS: Cardiovascular: No significant vascular findings. Normal heart size. No pericardial effusion. Mediastinum/Nodes: No enlarged mediastinal, hilar, or axillary lymph nodes. Thyroid gland, trachea, and esophagus demonstrate no significant findings. Lungs/Pleura: Lungs are clear. No pleural effusion or pneumothorax. Upper Abdomen: No acute abnormality.  Hepatic steatosis. Musculoskeletal: No chest wall mass or suspicious bone lesions identified. There is a superficial subcutaneous air and fluid collection at the midline midline left inframammary fold measuring 3.6 x 3.0 x 1.4 cm (series 2, image 93, series 6, image 157). IMPRESSION: 1. There is a superficial subcutaneous air and fluid collection at the midline midline left inframammary fold measuring 3.6 cm, consistent with abscess. There are no specific imaging findings of necrotizing fasciitis on the current examination, however CT can significantly understate the extent of rapidly progressing fascial infection and surgical consultation should not be delayed if warranted by physical examination and clinical suspicion. 2. Hepatic steatosis. Electronically Signed   By: AEddie CandleM.D.   On: 06/27/2020 19:16   DG Chest Port 1 View  Result Date: 06/27/2020 CLINICAL DATA:  Sepsis. EXAM: PORTABLE CHEST 1 VIEW COMPARISON:  None. FINDINGS: The cardiomediastinal contours are normal. The lungs are clear. Pulmonary vasculature is normal. No consolidation, pleural effusion, or pneumothorax. No acute osseous  abnormalities are seen. IMPRESSION: Negative AP view of the chest. Electronically Signed   By: MKeith RakeM.D.   On: 06/27/2020 18:36    EKG: Independently reviewed.  Vent. rate 138 BPM PR interval 130 ms QRS duration 78 ms QT/QTc 312/472 ms P-R-T axes 69 64 49 Sinus tachycardia Otherwise normal EKG.  Assessment/Plan Principal Problem:   Sepsis due to cellulitis (HCC)   Abscess, left breast Admit to telemetry/inpatient. Continue IV fluids per Keep n.p.o. until seen by surgery. Continue vancomycin per pharmacy. Continue Zosyn per pharmacy. Analgesics as needed. General surgery evaluation.  Active Problems:   Hypothyroidism Continue levothyroxine  50 mcg p.o. daily.    Class 3 obesity Weight loss and lifestyle modifications advised. Follow-up with PCP closely.    Type 2 diabetes mellitus (HCC) Currently NPO. CBG monitoring with R ISS.    Hypokalemia Replacing. Check magnesium level. Check phosphorus level. Follow-up potassium level. Continue replacement as needed.    DVT prophylaxis: Lovenox SQ. Code Status:   Full code. Family Communication:   Disposition Plan:   Patient is from:  Home.  Anticipated DC to:  Home.  Anticipated DC date:  06/29/2020.  Anticipated DC barriers: Clinical status.  Consults called:  Per EDP, general surgery will consult.   Admission status:  Inpatient/telemetry.   Severity of Illness: High due to sepsis secondary to a skin abscess on the patient and with diabetes and class III obesity who will require IV antibiotic therapy and possibly I&D of abscess.Reubin Milan MD [facility]  How to contact the [facility] Attending or Consulting provider [place] or covering provider during after hours [place], for this patient?   1. Check the care team in [facility] and look for a) attending/consulting [facility] provider listed and b) the [facility] team listed 2. Log into [email or web address] and use [place]'s universal password to access. If you do  not have the password, please contact the hospital operator. 3. Locate the [facility] provider you are looking for under [facility] and page to a number that you can be directly reached. 4. If you still have difficulty reaching the provider, please page the [facility] (Director on Call) for the Hospitalists listed on amion for assistance.  06/28/2020, 6:46 AM   This document was prepared using Dragon voice recognition software may contain some unintended transcription errors.

## 2020-06-28 NOTE — Anesthesia Postprocedure Evaluation (Signed)
Anesthesia Post Note  Patient: Veronica Gay  Procedure(s) Performed: incision and drainage left abdominal wall abcess (Left Abdomen)     Patient location during evaluation: PACU Anesthesia Type: General Level of consciousness: awake and alert Pain management: pain level controlled Vital Signs Assessment: post-procedure vital signs reviewed and stable Respiratory status: spontaneous breathing, nonlabored ventilation and respiratory function stable Cardiovascular status: blood pressure returned to baseline and stable Postop Assessment: no apparent nausea or vomiting Anesthetic complications: no   No complications documented.  Last Vitals:  Vitals:   06/28/20 1130 06/28/20 1146  BP: 106/77 112/72  Pulse: 90 78  Resp: 16 20  Temp: 36.7 C 36.4 C  SpO2: 96% 99%    Last Pain:  Vitals:   06/28/20 1146  TempSrc: Oral  PainSc:                  Christinamarie Tall,W. EDMOND

## 2020-06-28 NOTE — Consult Note (Signed)
Reason for Consult:abscess Referring Physician: Krista Blue PA  Veronica Gay is an 29 y.o. female.  HPI: 65 yof who has pmh of anxiety, pcos, fibro presents with pain and tenderness on ab wall below im fold.  She had prior cyst drained by derm years ago at this location. Since Friday this area has become progressively tender and red.  She also has had associated chest pressure, palpitations, ha and fatigue.  She was febrile in er, tachycardic. Had ct scan with collection at this site and was transferred here.    Past Medical History:  Diagnosis Date  . ANEMIA, IRON DEFICIENCY 04/23/2010   Qualifier: Diagnosis of  By: Madilyn Fireman MD, Barnetta Chapel    . Anxiety   . Back pain   . CFS (chronic fatigue syndrome)   . Complicated migraine    last episode 2012  . Diabetes mellitus without complication ([place])   . Dyslipidemia 12/03/2018  . Fibromyalgia   . History of gestational diabetes   . Hypothyroidism   . IBS (irritable bowel syndrome)   . Joint pain   . Metabolic syndrome   . PCOS (polycystic ovarian syndrome)   . Vitamin D deficiency     Past Surgical History:  Procedure Laterality Date  . CESAREAN SECTION    . TONSILLECTOMY      Family History  Problem Relation Age of Onset  . Lung cancer Other        grandfather  . Depression Other        mom,sister  . Diabetes Other        grandmother,grandfather  . Hypertension Other        father  . Thyroid disease Other        father  . Diabetes Mother   . Anxiety disorder Mother   . Depression Mother   . Sleep apnea Mother   . Eating disorder Mother   . Diabetes Father   . Fibromyalgia Father   . Multiple myeloma Father   . Anxiety disorder Father   . Depression Father   . Breast cancer Maternal Aunt   . Breast cancer Maternal Grandmother   . Lung cancer Maternal Grandfather   . Diabetes Paternal Grandfather   . Anxiety disorder Sister     Social History:  reports that she has never smoked. She has never used smokeless  tobacco. She reports that she does not drink alcohol and does not use drugs.  Allergies:  Allergies  Allergen Reactions  . Cephalosporins Hives    Reaction info from RN who received the patient 12/12.     Medications: I have reviewed the patient's current medications.  Results for orders placed or performed during the hospital encounter of 06/27/20 (from the past 48 hour(s))  Urinalysis, Routine w reflex microscopic Urine, Clean Catch     Status: None   Collection Time: 06/27/20  5:15 PM  Result Value Ref Range   Color, Urine YELLOW YELLOW   APPearance CLEAR CLEAR   Specific Gravity, Urine 1.015 1.005 - 1.030   pH 7.0 5.0 - 8.0   Glucose, UA NEGATIVE NEGATIVE mg/dL   Hgb urine dipstick NEGATIVE NEGATIVE   Bilirubin Urine NEGATIVE NEGATIVE   Ketones, ur NEGATIVE NEGATIVE mg/dL   Protein, ur NEGATIVE NEGATIVE mg/dL   Nitrite NEGATIVE NEGATIVE   Leukocytes,Ua NEGATIVE NEGATIVE    Comment: Microscopic not done on urines with negative protein, blood, leukocytes, nitrite, or glucose < 500 mg/dL. Performed at [facility], [address] [place], [place] [address]   Pregnancy, urine     Status: None   Collection Time: 06/27/20  5:15 PM  Result Value Ref Range   Preg Test, Ur NEGATIVE NEGATIVE    Comment:        THE SENSITIVITY OF THIS METHODOLOGY IS >20 mIU/mL. Performed at [facility], [address] [place], [place] [address]   C-reactive protein     Status: Abnormal   Collection Time: 06/27/20  5:33 PM  Result Value Ref Range   CRP 2.3 (H) <1.0 mg/dL    Comment: Performed at [facility], [place] [address] [place], [place] [address]  Comprehensive metabolic panel     Status: Abnormal   Collection Time: 06/27/20  5:43 PM  Result Value Ref Range   Sodium 135 135 - 145 mmol/L   Potassium 3.3 (L) 3.5 - 5.1 mmol/L   Chloride 100 98 - 111 mmol/L   CO2 21 (L) 22 - 32 mmol/L   Glucose, Bld 148 (H) 70 - 99 mg/dL    Comment: Glucose reference range  applies only to samples taken after fasting for at least 8 hours.   BUN 11 6 - 20 mg/dL   Creatinine, Ser 0.74 0.44 - 1.00 mg/dL   Calcium 9.1 8.9 - 10.3 mg/dL   Total Protein 7.3 6.5 - 8.1 g/dL   Albumin 4.0 3.5 - 5.0 g/dL   AST 26 15 - 41 U/L   ALT 22 0 - 44 U/L   Alkaline Phosphatase 43 38 - 126 U/L   Total Bilirubin 0.3 0.3 - 1.2 mg/dL   GFR, Estimated >60 >60 mL/min    Comment: (NOTE) Calculated using the CKD-EPI Creatinine Equation (2021)    Anion gap 14 5 - 15    Comment: Performed at [facility], [address] [place], [place] [address]  CBC with Differential     Status: Abnormal   Collection Time: 06/27/20  5:43 PM  Result Value Ref Range   WBC 14.9 (H) 4.0 - 10.5 K/uL   RBC 4.46 3.87 - 5.11 MIL/uL   Hemoglobin 12.5 12.0 - 15.0 g/dL   HCT 36.3 36.0 - 46.0 %   MCV 81.4 80.0 - 100.0 fL   MCH 28.0 26.0 - 34.0 pg   MCHC 34.4 30.0 - 36.0 g/dL   RDW 12.9 11.5 - 15.5 %   Platelets 330 150 - 400 K/uL   nRBC 0.0 0.0 - 0.2 %   Neutrophils Relative % 69 %   Neutro Abs 10.4 (H) 1.7 - 7.7 K/uL   Lymphocytes Relative 19 %   Lymphs Abs 2.9 0.7 - 4.0 K/uL   Monocytes Relative 7 %   Monocytes Absolute 1.0 0.1 - 1.0 K/uL   Eosinophils Relative 3 %   Eosinophils Absolute 0.4 0.0 - 0.5 K/uL   Basophils Relative 1 %   Basophils Absolute 0.1 0.0 - 0.1 K/uL   Immature Granulocytes 1 %   Abs Immature Granulocytes 0.14 (H) 0.00 - 0.07 K/uL    Comment: Performed at [facility], [address] [place], [place] [address]  Protime-INR     Status: None   Collection Time: 06/27/20  5:43 PM  Result Value Ref Range   Prothrombin Time 12.0 11.4 - 15.2 seconds   INR 0.9 0.8 - 1.2    Comment: (NOTE) INR goal varies based on device and disease states. Performed at [facility], [address] [place], [place] [address]   Lactic acid, plasma     Status: Abnormal   Collection Time: 06/27/20  5:45 PM  Result Value Ref Range   Lactic Acid, Venous 3.4 (HH)  0.5 - 1.9 mmol/L    Comment: CRITICAL RESULT CALLED TO, READ BACK BY AND VERIFIED WITHJosph Macho RN [phone] [place] [address] Performed at [facility], [address] [place], [place] [address]   Culture, blood (Routine x 2)     Status: None (Preliminary result)   Collection Time: 06/27/20  6:03 PM   Specimen: Left Antecubital; Blood  Result Value Ref Range   Specimen Description      LEFT ANTECUBITAL BLOOD Performed at [facility], [place] [address], [place], [place] [address]    Special Requests      BOTTLES DRAWN AEROBIC AND ANAEROBIC Blood Culture adequate volume Performed at [facility], [address] [place], [place] [address]    Culture PENDING    Report Status PENDING   Lactic acid, plasma     Status: None   Collection Time: 06/27/20  7:58 PM  Result Value Ref Range   Lactic Acid, Venous 1.1 0.5 - 1.9 mmol/L    Comment: Performed at [facility], [address] [place], [place] [address]  Resp Panel by RT-PCR (Flu A&B, Covid) Nasopharyngeal Swab     Status: None   Collection Time: 06/27/20  8:32 PM   Specimen: Nasopharyngeal Swab; Nasopharyngeal(NP) swabs in vial transport medium  Result Value Ref Range   SARS Coronavirus 2 by RT PCR NEGATIVE NEGATIVE    Comment: (NOTE) SARS-CoV-2 target nucleic acids are NOT DETECTED.  The SARS-CoV-2 RNA is generally detectable in upper respiratory specimens during the acute phase of infection. The lowest concentration of SARS-CoV-2 viral copies this assay can detect is 138 copies/mL. A negative result does not preclude SARS-Cov-2 infection and should not be used as the sole basis for treatment or other patient management decisions. A negative result may occur with  improper specimen collection/handling, submission of specimen other than nasopharyngeal swab, presence of viral mutation(s) within the areas targeted by this assay, and inadequate number of viral copies(<138 copies/mL). A negative  result must be combined with clinical observations, patient history, and epidemiological information. The expected result is Negative.  Fact Sheet for Patients:  EntrepreneurPulse.com.au  Fact Sheet for Healthcare Providers:  IncredibleEmployment.be  This test is no t yet approved or cleared by the [place] FDA and  has been authorized for detection and/or diagnosis of SARS-CoV-2 by FDA under an Emergency Use Authorization (EUA). This EUA will remain  in effect (meaning this test can be used) for the duration of the COVID-19 declaration under Section 564(b)(1) of the Act, 21 U.S.C.section 360bbb-3(b)(1), unless the authorization is terminated  or revoked sooner.       Influenza A by PCR NEGATIVE NEGATIVE   Influenza B by PCR NEGATIVE NEGATIVE    Comment: (NOTE) The Xpert Xpress SARS-CoV-2/FLU/RSV plus assay is intended as an aid in the diagnosis of influenza from Nasopharyngeal swab specimens and should not be used as a sole basis for treatment. Nasal washings and aspirates are unacceptable for Xpert Xpress SARS-CoV-2/FLU/RSV testing.  Fact Sheet for Patients: EntrepreneurPulse.com.au  Fact Sheet for Healthcare Providers: IncredibleEmployment.be  This test is not yet approved or cleared by the [place] FDA and has been authorized for detection and/or diagnosis of SARS-CoV-2 by FDA under an Emergency Use Authorization (EUA).  This EUA will remain in effect (meaning this test can be used) for the duration of the COVID-19 declaration under Section 564(b)(1) of the Act, 21 U.S.C. section 360bbb-3(b)(1), unless the authorization is terminated or revoked.  Performed at [facility], [address] [place], [place] [address]   CBC WITH DIFFERENTIAL     Status: Abnormal   Collection Time: 06/28/20  6:36 AM  Result Value Ref Range   WBC 10.3 4.0 - 10.5 K/uL   RBC 4.16 3.87 - 5.11 MIL/uL    Hemoglobin 11.6 (L) 12.0 - 15.0 g/dL   HCT 35.1 (L) 36.0 - 46.0 %   MCV 84.4 80.0 - 100.0 fL   MCH 27.9 26.0 - 34.0 pg   MCHC 33.0 30.0 - 36.0 g/dL   RDW 13.2 11.5 - 15.5 %   Platelets 281 150 - 400 K/uL   nRBC 0.0 0.0 - 0.2 %   Neutrophils Relative % 59 %   Neutro Abs 6.2 1.7 - 7.7 K/uL   Lymphocytes Relative 25 %   Lymphs Abs 2.6 0.7 - 4.0 K/uL   Monocytes Relative 8 %   Monocytes Absolute 0.8 0.1 - 1.0 K/uL   Eosinophils Relative 6 %   Eosinophils Absolute 0.6 (H) 0.0 - 0.5 K/uL   Basophils Relative 1 %   Basophils Absolute 0.1 0.0 - 0.1 K/uL   Immature Granulocytes 1 %   Abs Immature Granulocytes 0.08 (H) 0.00 - 0.07 K/uL    Comment: Performed at [facility], [place] [address] [place], [place] [address]  Magnesium     Status: None   Collection Time: 06/28/20  6:36 AM  Result Value Ref Range   Magnesium 2.1 1.7 - 2.4 mg/dL    Comment: Performed at [facility], [place] [address], [place], [place] [address]  Phosphorus     Status: None   Collection Time: 06/28/20  6:36 AM  Result Value Ref Range   Phosphorus 3.9 2.5 - 4.6 mg/dL    Comment: Performed at [facility], [place] [address], [place], [place] [address]  Basic metabolic panel     Status: Abnormal   Collection Time: 06/28/20  6:36 AM  Result Value Ref Range   Sodium 137 135 - 145 mmol/L   Potassium 3.7 3.5 - 5.1 mmol/L   Chloride 103 98 - 111 mmol/L   CO2 24 22 - 32 mmol/L   Glucose, Bld 127 (H) 70 - 99 mg/dL    Comment: Glucose reference range applies only to samples taken after fasting for at least 8 hours.   BUN 10 6 - 20 mg/dL   Creatinine, Ser 0.60 0.44 - 1.00 mg/dL   Calcium 8.6 (L) 8.9 - 10.3 mg/dL   GFR, Estimated >60 >60 mL/min    Comment: (NOTE) Calculated using the CKD-EPI Creatinine Equation (2021)    Anion gap 10 5 - 15    Comment: Performed at [facility], [place] [address] [place], [place] [address]  Protime-INR     Status:  None   Collection Time: 06/28/20  6:36 AM  Result Value Ref Range   Prothrombin Time 13.4 11.4 - 15.2 seconds   INR 1.1 0.8 - 1.2    Comment: (NOTE) INR goal varies based on device and disease states. Performed at [facility], [place] [address], [place], [place] [address]   APTT     Status: None   Collection Time: 06/28/20  6:36 AM  Result  Value Ref Range   aPTT 26 24 - 36 seconds    Comment: Performed at [facility], [place] [address], [place], [place] [address]    CT Chest W Contrast  Result Date: 06/27/2020 CLINICAL DATA:  Sepsis, concern for necrotizing fasciitis involving the anterior chest wall, painful, red, and malodorous cyst under the left breast, shortness of breath EXAM: CT CHEST WITH CONTRAST TECHNIQUE: Multidetector CT imaging of the chest was performed during intravenous contrast administration. CONTRAST:  57m OMNIPAQUE IOHEXOL 300 MG/ML  SOLN COMPARISON:  None. FINDINGS: Cardiovascular: No significant vascular findings. Normal heart size. No pericardial effusion. Mediastinum/Nodes: No enlarged mediastinal, hilar, or axillary lymph nodes. Thyroid gland, trachea, and esophagus demonstrate no significant findings. Lungs/Pleura: Lungs are clear. No pleural effusion or pneumothorax. Upper Abdomen: No acute abnormality.  Hepatic steatosis. Musculoskeletal: No chest wall mass or suspicious bone lesions identified. There is a superficial subcutaneous air and fluid collection at the midline midline left inframammary fold measuring 3.6 x 3.0 x 1.4 cm (series 2, image 93, series 6, image 157). IMPRESSION: 1. There is a superficial subcutaneous air and fluid collection at the midline midline left inframammary fold measuring 3.6 cm, consistent with abscess. There are no specific imaging findings of necrotizing fasciitis on the current examination, however CT can significantly understate the extent of rapidly progressing fascial infection and surgical  consultation should not be delayed if warranted by physical examination and clinical suspicion. 2. Hepatic steatosis. Electronically Signed   By: AEddie CandleM.D.   On: 06/27/2020 19:16   DG Chest Port 1 View  Result Date: 06/27/2020 CLINICAL DATA:  Sepsis. EXAM: PORTABLE CHEST 1 VIEW COMPARISON:  None. FINDINGS: The cardiomediastinal contours are normal. The lungs are clear. Pulmonary vasculature is normal. No consolidation, pleural effusion, or pneumothorax. No acute osseous abnormalities are seen. IMPRESSION: Negative AP view of the chest. Electronically Signed   By: MKeith RakeM.D.   On: 06/27/2020 18:36    Review of Systems  Constitutional: Positive for fever.  Respiratory: Positive for chest tightness and shortness of breath.   Cardiovascular: Positive for palpitations.  All other systems reviewed and are negative.  Blood pressure 125/85, pulse 84, temperature 97.9 F (36.6 C), temperature source Oral, resp. rate 20, height _0  (1.6 m), weight 107.3 kg, SpO2 98 %. Physical Exam Constitutional:      Appearance: Normal appearance.  HENT:     Head: Normocephalic and atraumatic.  Eyes:     General: No scleral icterus.    Extraocular Movements: Extraocular movements intact.  Cardiovascular:     Rate and Rhythm: Regular rhythm. Tachycardia present.  Pulmonary:     Effort: Pulmonary effort is normal.     Breath sounds: Normal breath sounds.  Chest:    Abdominal:     Palpations: Abdomen is soft.  Skin:    General: Skin is warm and dry.     Capillary Refill: Capillary refill takes less than 2 seconds.  Neurological:     General: No focal deficit present.     Mental Status: She is alert.  Psychiatric:        Mood and Affect: Mood normal.        Behavior: Behavior normal.     Assessment/Plan: Left abdominal wall abscess -sounds like has prior eic at this site -will plan for incision and drainage in OR this am, discussed open wound, cultures, likely here until am  with associated symptoms -appreciate medical assistance  MRolm Bookbinder12/06/2020, 7:43 AM

## 2020-06-28 NOTE — Plan of Care (Signed)
  Problem: Health Behavior/Discharge Planning: Goal: Ability to manage health-related needs will improve Outcome: Progressing   Problem: Clinical Measurements: Goal: Will remain free from infection Outcome: Progressing Goal: Diagnostic test results will improve Outcome: Progressing   Problem: Pain Managment: Goal: General experience of comfort will improve Outcome: Progressing   Problem: Safety: Goal: Ability to remain free from injury will improve Outcome: Progressing   Problem: Skin Integrity: Goal: Risk for impaired skin integrity will decrease Outcome: Progressing

## 2020-06-28 NOTE — Plan of Care (Signed)
  Problem: Health Behavior/Discharge Planning: Goal: Ability to manage health-related needs will improve Outcome: Progressing   Problem: Clinical Measurements: Goal: Will remain free from infection Outcome: Progressing Goal: Diagnostic test results will improve Outcome: Progressing   Problem: Pain Managment: Goal: General experience of comfort will improve Outcome: Progressing   Problem: Safety: Goal: Ability to remain free from injury will improve Outcome: Progressing   Problem: Skin Integrity: Goal: Risk for impaired skin integrity will decrease Outcome: Progressing   Problem: Education: Goal: Required Educational Video(s) Outcome: Progressing   Problem: Clinical Measurements: Goal: Postoperative complications will be avoided or minimized Outcome: Progressing   Problem: Skin Integrity: Goal: Demonstration of wound healing without infection will improve Outcome: Progressing

## 2020-06-28 NOTE — Op Note (Signed)
Preoperative diagnosis: Abdominal wall abscess Postoperative diagnosis: Same as above Procedure: Incision and drainage of abdominal wall abscess Surgeon: Dr. Harden Mo Anesthesia: General Estimated blood loss: Minimal Complications: None Drains: None Specimens: Cultures to microbiology Special count was correct completion Decision to recovery stable condition  Indications: This is a 29 year old female with multiple medical problems who comes in with a left abdominal wall abscess.  She and I discussed going to the operating room to drain this area.  Procedure: After informed consent was obtained the patient was taken to the operating room.  She was already on antibiotics.  SCDs were in place.  She was placed under general anesthesia with an LMA.  She was prepped and draped in the standard sterile surgical fashion.  Surgical timeout was then performed.  I made an elliptical incision overlying the abscess.  There was an immediate return of a large amount of purulence under pressure.  I took cultures.  I remove the ellipse and then proceeded to drain the entire cavity.  I did debride some of the underlying tissue as it did not look healthy.  I then obtained hemostasis.  Irrigation was performed.  I then packed this with iodoform and dressings were placed.  She tolerated this well was extubated and transferred to recovery stable.

## 2020-06-28 NOTE — Transfer of Care (Signed)
Immediate Anesthesia Transfer of Care Note  Patient: Veronica Gay  Procedure(s) Performed: incision and drainage left abdominal wall abcess (Left Abdomen)  Patient Location: PACU  Anesthesia Type:General  Level of Consciousness: sedated and patient cooperative  Airway & Oxygen Therapy: Patient Spontanous Breathing and Patient connected to face mask oxygen  Post-op Assessment: Report given to RN, Post -op Vital signs reviewed and stable and Patient moving all extremities X 4  Post vital signs: stable  Last Vitals:  Vitals Value Taken Time  BP 98/56 06/28/20 1046  Temp 36.4 C 06/28/20 1046  Pulse 89 06/28/20 1056  Resp 12 06/28/20 1056  SpO2 100 % 06/28/20 1056  Vitals shown include unvalidated device data.  Last Pain:  Vitals:   06/28/20 1046  TempSrc:   PainSc: Asleep      Patients Stated Pain Goal: 3 (06/28/20 0900)  Complications: No complications documented.

## 2020-06-29 ENCOUNTER — Other Ambulatory Visit (HOSPITAL_COMMUNITY): Payer: Self-pay | Admitting: Internal Medicine

## 2020-06-29 ENCOUNTER — Encounter (HOSPITAL_COMMUNITY): Payer: Self-pay | Admitting: General Surgery

## 2020-06-29 DIAGNOSIS — E876 Hypokalemia: Secondary | ICD-10-CM

## 2020-06-29 DIAGNOSIS — E039 Hypothyroidism, unspecified: Secondary | ICD-10-CM

## 2020-06-29 DIAGNOSIS — L0291 Cutaneous abscess, unspecified: Secondary | ICD-10-CM

## 2020-06-29 LAB — CBC
HCT: 34.7 % — ABNORMAL LOW (ref 36.0–46.0)
Hemoglobin: 11.4 g/dL — ABNORMAL LOW (ref 12.0–15.0)
MCH: 28.5 pg (ref 26.0–34.0)
MCHC: 32.9 g/dL (ref 30.0–36.0)
MCV: 86.8 fL (ref 80.0–100.0)
Platelets: 282 10*3/uL (ref 150–400)
RBC: 4 MIL/uL (ref 3.87–5.11)
RDW: 13.2 % (ref 11.5–15.5)
WBC: 8.5 10*3/uL (ref 4.0–10.5)
nRBC: 0 % (ref 0.0–0.2)

## 2020-06-29 LAB — BASIC METABOLIC PANEL
Anion gap: 10 (ref 5–15)
BUN: 7 mg/dL (ref 6–20)
CO2: 23 mmol/L (ref 22–32)
Calcium: 8.4 mg/dL — ABNORMAL LOW (ref 8.9–10.3)
Chloride: 104 mmol/L (ref 98–111)
Creatinine, Ser: 0.63 mg/dL (ref 0.44–1.00)
GFR, Estimated: >60 mL/min (ref >60)
Glucose, Bld: 118 mg/dL — ABNORMAL HIGH (ref 70–99)
Potassium: 4.1 mmol/L (ref 3.5–5.1)
Sodium: 137 mmol/L (ref 135–145)

## 2020-06-29 MED ORDER — OXYCODONE HCL 5 MG PO TABS: 5.0000 mg | ORAL_TABLET | Freq: Four times a day (QID) | ORAL | 0 refills | Status: DC | PRN

## 2020-06-29 MED ORDER — TRAMADOL HCL 50 MG PO TABS
50.0000 mg | ORAL_TABLET | Freq: Four times a day (QID) | ORAL | 0 refills | Status: AC | PRN
Start: 2020-06-29 — End: 2021-06-29

## 2020-06-29 MED ORDER — AMOXICILLIN-POT CLAVULANATE 875-125 MG PO TABS: 1.0000 | ORAL_TABLET | Freq: Two times a day (BID) | ORAL | 0 refills | Status: AC

## 2020-06-29 MED FILL — traMADol HCL 50 MG TABS: 50 | 2 days supply | Qty: 10 | Fill #0

## 2020-06-29 MED FILL — AMOX-CLAV 875-125 MG TABLET: 875-125 | 7 days supply | Qty: 14 | Fill #0

## 2020-06-29 NOTE — Progress Notes (Signed)
1 Day Post-Op  Subjective: Doing well, minimal complaints.  Pain seems well controlled.  ROS: See above, otherwise other systems negative  Objective: Vital signs in last 24 hours: Temp:  [97.6 F (36.4 C)-98.5 F (36.9 C)] 98.3 F (36.8 C) (12/13 0600) Pulse Rate:  [78-106] 97 (12/13 0600) Resp:  [11-20] 18 (12/13 0600) BP: (98-132)/(56-91) 132/91 (12/13 0600) SpO2:  [94 %-100 %] 94 % (12/13 0600) Last BM Date: 06/27/20  Intake/Output from previous day: 12/12 0701 - 12/13 0700 In: 2645.2 [P.O.:635; I.V.:1750; IV Piggyback:260.2] Out: 3025 [Urine:3000; Blood:25] Intake/Output this shift: No intake/output data recorded.  PE: Skin: left upper abdominal wall wound is clean, packing removed.  No further purulent drainage.  New packing replaced.  Decrease in cellulitis present around the wound.  Lab Results:  Recent Labs    06/28/20 0636 06/29/20 0422  WBC 10.3 8.5  HGB 11.6* 11.4*  HCT 35.1* 34.7*  PLT 281 282   BMET Recent Labs    06/28/20 0636 06/29/20 0422  NA 137 137  K 3.7 4.1  CL 103 104  CO2 24 23  GLUCOSE 127* 118*  BUN 10 7  CREATININE 0.60 0.63  CALCIUM 8.6* 8.4*   PT/INR Recent Labs    06/27/20 1743 06/28/20 0636  LABPROT 12.0 13.4  INR 0.9 1.1   CMP     Component Value Date/Time   NA 137 06/29/2020 0422   NA 138 08/22/2019 1400   K 4.1 06/29/2020 0422   CL 104 06/29/2020 0422   CO2 23 06/29/2020 0422   GLUCOSE 118 (H) 06/29/2020 0422   BUN 7 06/29/2020 0422   BUN 12 08/22/2019 1400   CREATININE 0.63 06/29/2020 0422   CREATININE 0.60 10/11/2016 1638   CALCIUM 8.4 (L) 06/29/2020 0422   PROT 7.3 06/27/2020 1743   PROT 6.9 08/22/2019 1400   ALBUMIN 4.0 06/27/2020 1743   ALBUMIN 4.6 08/22/2019 1400   AST 26 06/27/2020 1743   ALT 22 06/27/2020 1743   ALKPHOS 43 06/27/2020 1743   BILITOT 0.3 06/27/2020 1743   BILITOT <0.2 08/22/2019 1400   GFRNONAA >60 06/29/2020 0422   GFRNONAA >89 10/11/2016 1638   GFRAA 132 08/22/2019 1400    GFRAA >89 10/11/2016 1638   Lipase     Component Value Date/Time   LIPASE 15 10/14/2009 2354       Studies/Results: CT Chest W Contrast  Result Date: 06/27/2020 CLINICAL DATA:  Sepsis, concern for necrotizing fasciitis involving the anterior chest wall, painful, red, and malodorous cyst under the left breast, shortness of breath EXAM: CT CHEST WITH CONTRAST TECHNIQUE: Multidetector CT imaging of the chest was performed during intravenous contrast administration. CONTRAST:  67mL OMNIPAQUE IOHEXOL 300 MG/ML  SOLN COMPARISON:  None. FINDINGS: Cardiovascular: No significant vascular findings. Normal heart size. No pericardial effusion. Mediastinum/Nodes: No enlarged mediastinal, hilar, or axillary lymph nodes. Thyroid gland, trachea, and esophagus demonstrate no significant findings. Lungs/Pleura: Lungs are clear. No pleural effusion or pneumothorax. Upper Abdomen: No acute abnormality.  Hepatic steatosis. Musculoskeletal: No chest wall mass or suspicious bone lesions identified. There is a superficial subcutaneous air and fluid collection at the midline midline left inframammary fold measuring 3.6 x 3.0 x 1.4 cm (series 2, image 93, series 6, image 157). IMPRESSION: 1. There is a superficial subcutaneous air and fluid collection at the midline midline left inframammary fold measuring 3.6 cm, consistent with abscess. There are no specific imaging findings of necrotizing fasciitis on the current examination, however CT can significantly understate  the extent of rapidly progressing fascial infection and surgical consultation should not be delayed if warranted by physical examination and clinical suspicion. 2. Hepatic steatosis. Electronically Signed   By: Lauralyn Primes M.D.   On: 06/27/2020 19:16   DG Chest Port 1 View  Result Date: 06/27/2020 CLINICAL DATA:  Sepsis. EXAM: PORTABLE CHEST 1 VIEW COMPARISON:  None. FINDINGS: The cardiomediastinal contours are normal. The lungs are clear. Pulmonary  vasculature is normal. No consolidation, pleural effusion, or pneumothorax. No acute osseous abnormalities are seen. IMPRESSION: Negative AP view of the chest. Electronically Signed   By: Narda Rutherford M.D.   On: 06/27/2020 18:36    Anti-infectives: Anti-infectives (From admission, onward)   Start     Dose/Rate Route Frequency Ordered Stop   06/29/20 0000  amoxicillin-clavulanate (AUGMENTIN) 875-125 MG tablet        1 tablet Oral Every 12 hours 06/29/20 0849 07/06/20 2359   06/28/20 0800  piperacillin-tazobactam (ZOSYN) IVPB 3.375 g        3.375 g 12.5 mL/hr over 240 Minutes Intravenous Every 8 hours 06/28/20 0637     06/28/20 0800  vancomycin (VANCOCIN) IVPB 1000 mg/200 mL premix        1,000 mg 200 mL/hr over 60 Minutes Intravenous Every 12 hours 06/28/20 0644     06/28/20 0645  ceFEPIme (MAXIPIME) 2 g in sodium chloride 0.9 % 100 mL IVPB  Status:  Discontinued        2 g 200 mL/hr over 30 Minutes Intravenous  Once 06/28/20 0553 06/28/20 0630   06/27/20 1800  piperacillin-tazobactam (ZOSYN) IVPB 3.375 g        3.375 g 100 mL/hr over 30 Minutes Intravenous  Once 06/27/20 1751 06/27/20 2111   06/27/20 1800  clindamycin (CLEOCIN) IVPB 600 mg        600 mg 100 mL/hr over 30 Minutes Intravenous  Once 06/27/20 1751 06/27/20 1929   06/27/20 1800  vancomycin (VANCOCIN) IVPB 1000 mg/200 mL premix        1,000 mg 200 mL/hr over 60 Minutes Intravenous  Once 06/27/20 1751 06/27/20 2031       Assessment/Plan  POD 1, s/p I&D of L upper abdominal wall abscess, Dr. Dwain Sarna 12/12 -wound is clean and looks good -packing changed.  RN to teach husband -will order HH if able to arrange -cultures showing gram - rods currently.  Can switch to augmentin for about a week total of abx therapy -discussed wound care with patient -follow up being arranged in our office -work note provided for patient as well. -she is surgically stable for DC home today.  I have d/w attending   FEN - carb mod  diet VTE - lovenox ID - [email or web address]   LOS: 1 day    Letha Cape , [facility] Surgery 06/29/2020, 8:52 AM Please see Amion for pager number during day hours 7:00am-4:30pm or 7:00am -11:30am on weekends

## 2020-06-29 NOTE — Discharge Instructions (Signed)
WOUND CARE: - midline dressing to be changed twice daily for 3-4 days then switch to daily - supplies: sterile saline, gauze, scissors, ABD pads, tape  - remove dressing and all packing carefully, moistening with sterile saline as needed to avoid packing/internal dressing sticking to the wound. - clean edges of skin around the wound with water/gauze, making sure there is no tape debris or leakage left on skin that could cause skin irritation or breakdown. - dampen and clean gauze with sterile saline and pack wound from wound base to skin level, making sure to take note of any possible areas of wound tracking, tunneling and packing appropriately. Wound can be packed loosely.  - cover wound with a dry ABD pad and secure with tape.  - write the date/time on the dry dressing/tape to better track when the last dressing change occurred. - apply any skin protectant/powder recommended by clinician to protect skin/skin folds. - change dressing as needed if leakage occurs, wound gets contaminated, or patient requests to shower. - patient may shower daily with wound open and following the shower the wound should be dried and a clean dressing placed.

## 2020-06-29 NOTE — Discharge Summary (Addendum)
Physician Discharge Summary  Veronica Gay JYN:[id number] DOB: 01-18-1991 DOA: 06/27/2020  PCP: Sunnie Nielsen, DO  Admit date: 06/27/2020 Discharge date: 06/29/2020  Admitted From: home Disposition:  home  Recommendations for Outpatient Follow-up:  1. Follow up with Surgery in 1 week  Home Health: none Equipment/Devices: none  Discharge Condition: stable CODE STATUS: Full code Diet recommendation: regular  HPI: Per admitting MD, Veronica Gay is a 29 y.o. female with medical history significant of history of iron deficiency anemia, anxiety, back pain, chronic fatigue syndrome, fibromyalgia, history of complicated migraine, type 2 diabetes, hyperlipidemia, hypothyroidism, IBS, PCOS, vitamin D deficiency who is coming to the emergency department due to progressively worse left breast pain since Friday associated with headache, fatigue, malaise, nausea, palpitations, transient dyspnea and transient chest pressure.  She had a similar infection about 6 years ago.  She denies rhinorrhea, sore throat, wheezing or hemoptysis.  No PND, orthopnea or pitting edema of the lower extremities.  Denies abdominal pain, emesis, diarrhea, constipation, melena or hematochezia.  No dysuria, frequency or hematuria.  Denies polyuria, polydipsia, polyphagia or blurred vision.  Hospital Course / Discharge diagnoses: Principal problem Sepsis due to cellulitis, left breast abscess-patient was admitted to the hospital, placed on intravenous antibiotics and general surgery was consulted.  She was taken to the OR on 12/12 and is status post drainage.  Cultures were sent, preliminary results show gram-negative rods.  Patient is eager to go home, is afebrile, WBC has normalized, feeling back to normal and her pain is controlled with oral pain medications.  She will be empirically placed on Augmentin, and discussed that we will follow cultures and if she needs a different antibiotics we will contact her after  discharge.  She expressed understanding and wants to go home.  Active problems Hypothyroidism-continue Synthroid DM2-outpatient management Obesity-based on a BMI of 41, she would benefit from weight loss Hypokalemia-repleted   Discharge Instructions   Allergies as of 06/29/2020      Reactions   Cephalosporins Hives   Reaction info from RN who received the patient 12/12.       Medication List    STOP taking these medications   dicyclomine 20 MG tablet Commonly known as: BENTYL   doxycycline 100 MG tablet Commonly known as: VIBRA-TABS   Norgestimate-Ethinyl Estradiol Triphasic 0.18/0.215/0.25 MG-25 MCG tab   ondansetron 4 MG disintegrating tablet Commonly known as: Zofran ODT   pantoprazole 40 MG tablet Commonly known as: PROTONIX   spironolactone 100 MG tablet Commonly known as: ALDACTONE   sucralfate 1 g tablet Commonly known as: Carafate   Vitamin D (Ergocalciferol) 1.25 MG (50000 UNIT) Caps capsule Commonly known as: DRISDOL     TAKE these medications   amoxicillin-clavulanate 875-125 MG tablet Commonly known as: Augmentin Take 1 tablet by mouth every 12 (twelve) hours for 7 days.   DULoxetine 60 MG capsule Commonly known as: CYMBALTA Take 1 capsule (60 mg total) by mouth 2 (two) times daily.   levothyroxine 50 MCG tablet Commonly known as: SYNTHROID Take 1 tablet (50 mcg total) by mouth daily before breakfast. Due for labs   traMADol 50 MG tablet Commonly known as: Ultram Take 1 tablet (50 mg total) by mouth every 6 (six) hours as needed.       Follow-up Information    Surgery, [facility] Follow up in 3 week(s).   Specialty: General Surgery Why: our office is working on follow up in 2-3 weeks for a wound check. Contact information: [address] [place] [address] [phone]               Consultations:  General surgery   Procedures/Studies:  I&D 12/12  CT Chest W Contrast  Result Date:  06/27/2020 CLINICAL DATA:  Sepsis, concern for necrotizing fasciitis involving the anterior chest wall, painful, red, and malodorous cyst under the left breast, shortness of breath EXAM: CT CHEST WITH CONTRAST TECHNIQUE: Multidetector CT imaging of the chest was performed during intravenous contrast administration. CONTRAST:  23mL OMNIPAQUE IOHEXOL 300 MG/ML  SOLN COMPARISON:  None. FINDINGS: Cardiovascular: No significant vascular findings. Normal heart size. No pericardial effusion. Mediastinum/Nodes: No enlarged mediastinal, hilar, or axillary lymph nodes. Thyroid gland, trachea, and esophagus demonstrate no significant findings. Lungs/Pleura: Lungs are clear. No pleural effusion or pneumothorax. Upper Abdomen: No acute abnormality.  Hepatic steatosis. Musculoskeletal: No chest wall mass or suspicious bone lesions identified. There is a superficial subcutaneous air and fluid collection at the midline midline left inframammary fold measuring 3.6 x 3.0 x 1.4 cm (series 2, image 93, series 6, image 157). IMPRESSION: 1. There is a superficial subcutaneous air and fluid collection at the midline midline left inframammary fold measuring 3.6 cm, consistent with abscess. There are no specific imaging findings of necrotizing fasciitis on the current examination, however CT can significantly understate the extent of rapidly progressing fascial infection and surgical consultation should not be delayed if warranted by physical examination and clinical suspicion. 2. Hepatic steatosis. Electronically Signed   By: Lauralyn Primes M.D.   On: 06/27/2020 19:16   DG Chest Port 1 View  Result Date: 06/27/2020 CLINICAL DATA:  Sepsis. EXAM: PORTABLE CHEST 1 VIEW COMPARISON:  None. FINDINGS: The cardiomediastinal contours are normal. The lungs are clear. Pulmonary vasculature is normal. No consolidation, pleural effusion, or pneumothorax. No acute osseous abnormalities are seen. IMPRESSION: Negative AP view of the chest.  Electronically Signed   By: Narda Rutherford M.D.   On: 06/27/2020 18:36     Subjective: - no chest pain, shortness of breath, no abdominal pain, nausea or vomiting.   Discharge Exam: BP (!) 132/91 (BP Location: Right Arm)   Pulse 97   Temp 98.3 F (36.8 C) (Oral)   Resp 18   Ht 5\' 3"  (1.6 m)   Wt 107.3 kg   SpO2 94%   BMI 41.90 kg/m   General: Pt is alert, awake, not in acute distress Cardiovascular: RRR, S1/S2 +, no rubs, no gallops Respiratory: CTA bilaterally, no wheezing, no rhonchi Abdominal: Soft, NT, ND, bowel sounds + Extremities: no edema, no cyanosis    The results of significant diagnostics from this hospitalization (including imaging, microbiology, ancillary and laboratory) are listed below for reference.     Microbiology: Recent Results (from the past 240 hour(s))  Culture, blood (Routine x 2)     Status: None (Preliminary result)   Collection Time: 06/27/20  5:45 PM   Specimen: BLOOD RIGHT HAND  Result Value Ref Range Status   Specimen Description   Final    BLOOD RIGHT HAND Performed at [facility], [address] [place], [place]    Special Requests   Final    BOTTLES DRAWN AEROBIC AND ANAEROBIC Blood Culture adequate volume Performed at [facility], [address] [place], [place]    Culture   Final    NO GROWTH < 12 HOURS Performed at [facility], [address], [place], [place]  Report Status PENDING  Incomplete  Culture, blood (Routine x 2)     Status: None (Preliminary result)   Collection Time: 06/27/20  6:03 PM   Specimen: Left Antecubital; Blood  Result Value Ref Range Status   Specimen Description   Final    LEFT ANTECUBITAL BLOOD Performed at [facility], [address], [place], [place] [address]    Special Requests   Final    BOTTLES DRAWN AEROBIC AND ANAEROBIC Blood Culture adequate volume Performed at [facility], [address] [place], [place] [address]    Culture   Final    NO GROWTH < 12 HOURS Performed at [facility], [address] [place], [place] [address]    Report Status PENDING  Incomplete  Resp Panel by RT-PCR (Flu A&B, Covid) Nasopharyngeal Swab     Status: None   Collection Time: 06/27/20  8:32 PM   Specimen: Nasopharyngeal Swab; Nasopharyngeal(NP) swabs in vial transport medium  Result Value Ref Range Status   SARS Coronavirus 2 by RT PCR NEGATIVE NEGATIVE Final    Comment: (NOTE) SARS-CoV-2 target nucleic acids are NOT DETECTED.  The SARS-CoV-2 RNA is generally detectable in upper respiratory specimens during the acute phase of infection. The lowest concentration of SARS-CoV-2 viral copies this assay can detect is 138 copies/mL. A negative result does not preclude SARS-Cov-2 infection and should not be used as the sole basis for treatment or other patient management decisions. A negative result may occur with  improper specimen collection/handling, submission of specimen other than nasopharyngeal swab, presence of viral mutation(s) within the areas targeted by this assay, and inadequate number of viral copies(<138 copies/mL). A negative result must be combined with clinical observations, patient history, and epidemiological information. The expected result is Negative.  Fact Sheet for Patients:  BloggerCourse.com  Fact Sheet for Healthcare Providers:  SeriousBroker.it  This test is no t yet approved or cleared by the [place] FDA and  has been authorized for detection and/or diagnosis of SARS-CoV-2 by FDA under an Emergency Use Authorization (EUA). This EUA will remain  in effect (meaning this test can be used) for the duration of the COVID-19 declaration under Section 564(b)(1) of the Act, 21 U.S.C.section 360bbb-3(b)(1), unless the authorization is terminated  or revoked sooner.       Influenza A by PCR NEGATIVE NEGATIVE Final    Influenza B by PCR NEGATIVE NEGATIVE Final    Comment: (NOTE) The Xpert Xpress SARS-CoV-2/FLU/RSV plus assay is intended as an aid in the diagnosis of influenza from Nasopharyngeal swab specimens and should not be used as a sole basis for treatment. Nasal washings and aspirates are unacceptable for Xpert Xpress SARS-CoV-2/FLU/RSV testing.  Fact Sheet for Patients: BloggerCourse.com  Fact Sheet for Healthcare Providers: SeriousBroker.it  This test is not yet approved or cleared by the [place] FDA and has been authorized for detection and/or diagnosis of SARS-CoV-2 by FDA under an Emergency Use Authorization (EUA). This EUA will remain in effect (meaning this test can be used) for the duration of the COVID-19 declaration under Section 564(b)(1) of the Act, 21 U.S.C. section 360bbb-3(b)(1), unless the authorization is terminated or revoked.  Performed at [facility], [address] [place], [place] [address]   Aerobic/Anaerobic Culture (surgical/deep wound)     Status: None (Preliminary result)   Collection Time: 06/28/20 10:27 AM   Specimen: PATH Other; Tissue  Result Value Ref Range Status   Specimen Description  Final    BREAST LEFT BREAST ABCESS Performed at [facility], [address], [place], [place] [address]    Special Requests   Final    NONE Performed at [facility], [address], [place], [place] [address]    Gram Stain   Final    RARE WBC PRESENT,BOTH PMN AND MONONUCLEAR RARE GRAM NEGATIVE RODS Performed at [facility], [address], [place], [place] [address]    Culture PENDING  Incomplete   Report Status PENDING  Incomplete     Labs: Basic Metabolic Panel: Recent Labs  Lab 06/27/20 1743 06/28/20 0636 06/29/20 0422  NA 135 137 137  K 3.3* 3.7 4.1  CL 100 103 104  CO2 21* 24 23  GLUCOSE 148* 127* 118*  BUN 11 10 7   CREATININE 0.74  0.60 0.63  CALCIUM 9.1 8.6* 8.4*  MG  --  2.1  --   PHOS  --  3.9  --    Liver Function Tests: Recent Labs  Lab 06/27/20 1743  AST 26  ALT 22  ALKPHOS 43  BILITOT 0.3  PROT 7.3  ALBUMIN 4.0   CBC: Recent Labs  Lab 06/27/20 1743 06/28/20 0636 06/29/20 0422  WBC 14.9* 10.3 8.5  NEUTROABS 10.4* 6.2  --   HGB 12.5 11.6* 11.4*  HCT 36.3 35.1* 34.7*  MCV 81.4 84.4 86.8  PLT 330 281 282   CBG: Recent Labs  Lab 06/28/20 0751 06/28/20 1102  GLUCAP 105* 105*   Hgb A1c Recent Labs    06/28/20 0636  HGBA1C 5.8*   Lipid Profile No results for input(s): CHOL, HDL, LDLCALC, TRIG, CHOLHDL, LDLDIRECT in the last 72 hours. Thyroid function studies No results for input(s): TSH, T4TOTAL, T3FREE, THYROIDAB in the last 72 hours.  Invalid input(s): FREET3 Urinalysis    Component Value Date/Time   COLORURINE YELLOW 06/27/2020 1715   APPEARANCEUR CLEAR 06/27/2020 1715   LABSPEC 1.015 06/27/2020 1715   PHURINE 7.0 06/27/2020 1715   GLUCOSEU NEGATIVE 06/27/2020 1715   HGBUR NEGATIVE 06/27/2020 1715   BILIRUBINUR NEGATIVE 06/27/2020 1715   BILIRUBINUR negative 10/21/2010 1024   KETONESUR NEGATIVE 06/27/2020 1715   PROTEINUR NEGATIVE 06/27/2020 1715   UROBILINOGEN 0.2 10/21/2010 1024   NITRITE NEGATIVE 06/27/2020 1715   LEUKOCYTESUR NEGATIVE 06/27/2020 1715    FURTHER DISCHARGE INSTRUCTIONS:   Get Medicines reviewed and adjusted: Please take all your medications with you for your next visit with your Primary MD   Laboratory/radiological data: Please request your Primary MD to go over all hospital tests and procedure/radiological results at the follow up, please ask your Primary MD to get all Hospital records sent to his/her office.   In some cases, they will be blood work, cultures and biopsy results pending at the time of your discharge. Please request that your primary care M.D. goes through all the records of your hospital data and follows up on these results.   Also  Note the following: If you experience worsening of your admission symptoms, develop shortness of breath, life threatening emergency, suicidal or homicidal thoughts you must seek medical attention immediately by calling 911 or calling your MD immediately  if symptoms less severe.   You must read complete instructions/literature along with all the possible adverse reactions/side effects for all the Medicines you take and that have been prescribed to you. Take any new Medicines after you have completely understood and accpet all the possible adverse reactions/side effects.    Do not  drive when taking Pain medications or sleeping medications (Benzodaizepines)   Do not take more than prescribed Pain, Sleep and Anxiety Medications. It is not advisable to combine anxiety,sleep and pain medications without talking with your primary care practitioner   Special Instructions: If you have smoked or chewed Tobacco  in the last 2 yrs please stop smoking, stop any regular Alcohol  and or any Recreational drug use.   Wear Seat belts while driving.   Please note: You were cared for by a hospitalist during your hospital stay. Once you are discharged, your primary care physician will handle any further medical issues. Please note that NO REFILLS for any discharge medications will be authorized once you are discharged, as it is imperative that you return to your primary care physician (or establish a relationship with a primary care physician if you do not have one) for your post hospital discharge needs so that they can reassess your need for medications and monitor your lab values.  Time coordinating discharge: 25 minutes  SIGNED:  Pamella Pertostin Keiland Pickering, MD, PhD 06/29/2020, 9:05 AM

## 2020-06-30 ENCOUNTER — Other Ambulatory Visit: Payer: Self-pay | Admitting: Endocrinology

## 2020-07-01 ENCOUNTER — Other Ambulatory Visit: Payer: Self-pay | Admitting: Endocrinology

## 2020-07-02 ENCOUNTER — Other Ambulatory Visit: Payer: Self-pay | Admitting: Endocrinology

## 2020-07-02 ENCOUNTER — Encounter: Payer: Self-pay | Admitting: *Deleted

## 2020-07-02 ENCOUNTER — Other Ambulatory Visit: Payer: Self-pay | Admitting: *Deleted

## 2020-07-02 NOTE — Patient Outreach (Signed)
[facility]) Care Management  07/02/2020  Veronica Gay [birth year] [id number]   Transition of care call/case closure   Referral received:06/29/20 Initial outreach:07/02/20 Insurance: [place] [employer]   Subjective: Initial successful telephone call to patient's preferred number in order to complete transition of care assessment; 2 HIPAA identifiers verified. Explained purpose of call and completed transition of care assessment.  Veronica Gay states that she is doing pretty good, denies post-operative problems. She reports managing wound care bid dressing changes well no sign or symptom of infection. She states pain  well managed with prescribed medications. She is  tolerating diet, denies bowel or bladder problems.  Spouse  assisting with her recovery.    She does not have the hospital indemnity  She uses a [facility] outpatient pharmacy at [facility]      Objective:  Veronica Gay  was hospitalized at [facility] 12/11-12/13/21 Sepsis due to cellulitis left upper abdominal wall abscess,  Incision and drainage. Comorbidities include: Hyperlipidemia, Type 2 Diabetes, complicated migraine.  She  was discharged to home on 12/13/21without the need for home health services or DME.   Assessment:  Patient voices good understanding of all discharge instructions.  See transition of care flowsheet for assessment details.   Plan:  Reviewed hospital discharge diagnosis of Left upper abdominal wall abscess   and discharge treatment plan using hospital discharge instructions, assessing medication adherence, reviewing problems requiring provider notification, and discussing the importance of follow up with surgeon, primary care provider and/or specialists as directed.  Reviewed [place] healthy lifestyle program information to receive discounted premium for  2022   Step 1: Get  your annual physical  Step 2: Complete your health assessment  Step 3:Identify your current health status  and complete the corresponding action step between January 1, and March 18, 2020.        No ongoing care management needs identified so will close case to [facility] Care Management services and route successful outreach letter with [facility] Care Management pamphlet and 24 Hour Nurse Line Magnet to [employer] Care Management clinical pool to be mailed to patient's home address.  Thanked patient for their services to [facility].  Egbert Garibaldi, RN, BSN  [facility] Care Management,Care Management Coordinator  [phone]- Mobile [phone]- Toll Free Main Office

## 2020-07-03 LAB — CULTURE, BLOOD (ROUTINE X 2)
Culture: NO GROWTH
Culture: NO GROWTH
Special Requests: ADEQUATE
Special Requests: ADEQUATE

## 2020-07-03 LAB — AEROBIC/ANAEROBIC CULTURE W GRAM STAIN (SURGICAL/DEEP WOUND)

## 2020-09-16 ENCOUNTER — Other Ambulatory Visit (HOSPITAL_COMMUNITY): Payer: Self-pay | Admitting: Osteopathic Medicine

## 2020-09-16 ENCOUNTER — Other Ambulatory Visit: Payer: Self-pay

## 2020-09-16 MED ORDER — DULOXETINE HCL 60 MG PO CPEP: 60.0000 mg | ORAL_CAPSULE | Freq: Two times a day (BID) | ORAL | 0 refills | Status: AC

## 2020-09-16 MED FILL — DULOXETINE HCL 60 MG CPEP: 60 | 90 days supply | Qty: 180 | Fill #0

## 2020-10-09 ENCOUNTER — Other Ambulatory Visit (HOSPITAL_BASED_OUTPATIENT_CLINIC_OR_DEPARTMENT_OTHER): Payer: Self-pay

## 2020-11-04 ENCOUNTER — Other Ambulatory Visit (HOSPITAL_COMMUNITY): Payer: Self-pay

## 2020-11-04 MED ORDER — LEVOTHYROXINE SODIUM 50 MCG PO TABS
50.0000 ug | ORAL_TABLET | Freq: Every day | ORAL | 3 refills | Status: AC
  Filled 2020-11-04: qty 30, 30d supply, fill #0

## 2020-11-05 ENCOUNTER — Other Ambulatory Visit (HOSPITAL_COMMUNITY): Payer: Self-pay

## 2020-11-07 ENCOUNTER — Other Ambulatory Visit (HOSPITAL_COMMUNITY): Payer: Self-pay

## 2020-11-09 ENCOUNTER — Other Ambulatory Visit (HOSPITAL_COMMUNITY): Payer: Self-pay

## 2021-07-24 ENCOUNTER — Emergency Department (HOSPITAL_BASED_OUTPATIENT_CLINIC_OR_DEPARTMENT_OTHER)
Admission: EM | Admit: 2021-07-24 | Discharge: 2021-07-25 | Disposition: A | Discharge status: Home / Self Care | Payer: 59 | Attending: Emergency Medicine | Admitting: Emergency Medicine

## 2021-07-24 ENCOUNTER — Emergency Department (HOSPITAL_BASED_OUTPATIENT_CLINIC_OR_DEPARTMENT_OTHER): Payer: 59

## 2021-07-24 ENCOUNTER — Other Ambulatory Visit: Payer: Self-pay

## 2021-07-24 ENCOUNTER — Encounter (HOSPITAL_BASED_OUTPATIENT_CLINIC_OR_DEPARTMENT_OTHER): Payer: Self-pay | Admitting: Emergency Medicine

## 2021-07-24 DIAGNOSIS — J698 Pneumonitis due to inhalation of other solids and liquids: Secondary | ICD-10-CM | POA: Diagnosis not present

## 2021-07-24 DIAGNOSIS — T65891A Toxic effect of other specified substances, accidental (unintentional), initial encounter: Secondary | ICD-10-CM | POA: Diagnosis not present

## 2021-07-24 DIAGNOSIS — J690 Pneumonitis due to inhalation of food and vomit: Secondary | ICD-10-CM

## 2021-07-24 DIAGNOSIS — E876 Hypokalemia: Secondary | ICD-10-CM | POA: Diagnosis not present

## 2021-07-24 DIAGNOSIS — D649 Anemia, unspecified: Secondary | ICD-10-CM | POA: Insufficient documentation

## 2021-07-24 DIAGNOSIS — D72829 Elevated white blood cell count, unspecified: Secondary | ICD-10-CM | POA: Diagnosis not present

## 2021-07-24 DIAGNOSIS — N9489 Other specified conditions associated with female genital organs and menstrual cycle: Secondary | ICD-10-CM | POA: Insufficient documentation

## 2021-07-24 DIAGNOSIS — R0602 Shortness of breath: Secondary | ICD-10-CM | POA: Diagnosis present

## 2021-07-24 DIAGNOSIS — R739 Hyperglycemia, unspecified: Secondary | ICD-10-CM | POA: Insufficient documentation

## 2021-07-24 DIAGNOSIS — R000 Tachycardia, unspecified: Secondary | ICD-10-CM | POA: Insufficient documentation

## 2021-07-24 LAB — CBC WITH DIFFERENTIAL/PLATELET
Abs Immature Granulocytes: 0.1 10*3/uL — ABNORMAL HIGH (ref 0.00–0.07)
Basophils Absolute: 0.1 10*3/uL (ref 0.0–0.1)
Basophils Relative: 1 %
Eosinophils Absolute: 0.3 10*3/uL (ref 0.0–0.5)
Eosinophils Relative: 3 %
HCT: 34.7 % — ABNORMAL LOW (ref 36.0–46.0)
Hemoglobin: 11.7 g/dL — ABNORMAL LOW (ref 12.0–15.0)
Immature Granulocytes: 1 %
Lymphocytes Relative: 37 %
Lymphs Abs: 4.1 10*3/uL — ABNORMAL HIGH (ref 0.7–4.0)
MCH: 28.3 pg (ref 26.0–34.0)
MCHC: 33.7 g/dL (ref 30.0–36.0)
MCV: 83.8 fL (ref 80.0–100.0)
Monocytes Absolute: 0.8 10*3/uL (ref 0.1–1.0)
Monocytes Relative: 7 %
Neutro Abs: 5.7 10*3/uL (ref 1.7–7.7)
Neutrophils Relative %: 51 %
Platelets: 290 10*3/uL (ref 150–400)
RBC: 4.14 MIL/uL (ref 3.87–5.11)
RDW: 14 % (ref 11.5–15.5)
WBC: 11 10*3/uL — ABNORMAL HIGH (ref 4.0–10.5)
nRBC: 0 % (ref 0.0–0.2)

## 2021-07-24 LAB — BASIC METABOLIC PANEL
Anion gap: 10 (ref 5–15)
BUN: 14 mg/dL (ref 6–20)
CO2: 23 mmol/L (ref 22–32)
Calcium: 9.3 mg/dL (ref 8.9–10.3)
Chloride: 107 mmol/L (ref 98–111)
Creatinine, Ser: 0.81 mg/dL (ref 0.44–1.00)
GFR, Estimated: >60 mL/min (ref >60)
Glucose, Bld: 138 mg/dL — ABNORMAL HIGH (ref 70–99)
Potassium: 3.4 mmol/L — ABNORMAL LOW (ref 3.5–5.1)
Sodium: 140 mmol/L (ref 135–145)

## 2021-07-24 LAB — TROPONIN I (HIGH SENSITIVITY): Troponin I (High Sensitivity): 3 ng/L (ref <18)

## 2021-07-24 LAB — BRAIN NATRIURETIC PEPTIDE: B Natriuretic Peptide: 12.4 pg/mL (ref 0.0–100.0)

## 2021-07-24 LAB — D-DIMER, QUANTITATIVE: D-Dimer, Quant: 0.52 ug/mL-FEU — ABNORMAL HIGH (ref 0.00–0.50)

## 2021-07-24 NOTE — ED Triage Notes (Signed)
Pt sts she aspirated yesterday during an endoscopy; she was transferred to [facility] and was given O2 and Lasix; sts she was discharged because her sats stayed above 90%; she has continued [facility]; sts sats drop with ambulation at home and she is coughing up blood-tinged sputum

## 2021-07-24 NOTE — ED Notes (Signed)
Pt ambulated from triage to Rm 6- O2 sats 94-96%, HR up to 130; SHOB noted

## 2021-07-24 NOTE — ED Provider Notes (Signed)
[facility] EMERGENCY DEPARTMENT Provider Note   CSN: [id number] Arrival date & time: 07/24/21  2222     History  Chief Complaint  Patient presents with   Post-op Problem    Veronica Gay is a 31 y.o. female.  HPI  31 year old female presenting to the emergency department with a chief complaint of productive cough, hemoptysis, shortness of breath, chest discomfort after an aspiration event yesterday during an endoscopic procedure.  The patient underwent EGD and was evaluated at the emergency department [facility] and treated for presumed flash pulmonary edema with oxygen and Lasix.  She thinks she aspirated.  She was discharged and throughout the day today has had progressive worsening shortness of breath, productive cough with mild pink-tinged sputum, chest pressure and tightness that radiates to her back.  She denies any fever or chills.  She checked her oxygen saturations at home and noted that her saturations were dropping with ambulation.  Home Medications Prior to Admission medications   Medication Sig Start Date End Date Taking? Authorizing Provider  amoxicillin-clavulanate (AUGMENTIN) 875-125 MG tablet Take 1 tablet by mouth every 12 (twelve) hours. 07/25/21  Yes Ernie Avena, MD  DULoxetine (CYMBALTA) 60 MG capsule Take 1 capsule (60 mg total) by mouth 2 (two) times daily. No refills. Needs an appt. 09/16/20   Sunnie Nielsen, DO  DULoxetine (CYMBALTA) 60 MG capsule TAKE 1 CAPSULE BY MOUTH 2 TIMES DAILY * NEEDS APPT 09/16/20 09/16/21  Sunnie Nielsen, DO  DULoxetine (CYMBALTA) 60 MG capsule TAKE 1 CAPSULE BY MOUTH TWO TIMES DAILY 04/01/20 04/01/21  Sunnie Nielsen, DO  DULoxetine (CYMBALTA) 60 MG capsule TAKE 1 CAPSULE BY MOUTH TWICE DAILY 03/26/20 03/26/21  Sunnie Nielsen, DO  levothyroxine (SYNTHROID) 50 MCG tablet Take 1 tablet (50 mcg total) by mouth daily before breakfast. Due for labs 03/26/20   Sunnie Nielsen, DO  levothyroxine (SYNTHROID) 50  MCG tablet Take 1 tablet (50 mcg total) by mouth daily before breakfast. 04/01/20   Sunnie Nielsen, DO      Allergies    Cephalosporins    Review of Systems   Review of Systems  Physical Exam Updated Vital Signs BP 111/64    Pulse 97    Temp 98.6 F (37 C) (Oral)    Resp 16    Ht 5\' 4"  (1.626 m)    Wt 99.8 kg    LMP 06/23/2021    SpO2 97%    BMI 37.76 kg/m  Physical Exam Vitals and nursing note reviewed.  Constitutional:      General: She is not in acute distress.    Appearance: She is well-developed.     Comments: Resting comfortably, occasionally coughing  HENT:     Head: Normocephalic and atraumatic.  Eyes:     Conjunctiva/sclera: Conjunctivae normal.     Pupils: Pupils are equal, round, and reactive to light.  Cardiovascular:     Rate and Rhythm: Normal rate and regular rhythm.     Heart sounds: No murmur heard. Pulmonary:     Effort: Pulmonary effort is normal. No respiratory distress.     Breath sounds: Rhonchi present.     Comments: Mild bilateral rhonchi present Abdominal:     General: There is no distension.     Palpations: Abdomen is soft.     Tenderness: There is no abdominal tenderness. There is no guarding.  Musculoskeletal:        General: No swelling, deformity or signs of injury.     Cervical back: Neck  supple.  Skin:    General: Skin is warm and dry.     Capillary Refill: Capillary refill takes less than 2 seconds.     Findings: No lesion or rash.  Neurological:     General: No focal deficit present.     Mental Status: She is alert. Mental status is at baseline.  Psychiatric:        Mood and Affect: Mood normal.    ED Results / Procedures / Treatments   Labs (all labs ordered are listed, but only abnormal results are displayed) Labs Reviewed  CBC WITH DIFFERENTIAL/PLATELET - Abnormal; Notable for the following components:      Result Value   WBC 11.0 (*)    Hemoglobin 11.7 (*)    HCT 34.7 (*)    Lymphs Abs 4.1 (*)    Abs Immature  Granulocytes 0.10 (*)    All other components within normal limits  BASIC METABOLIC PANEL - Abnormal; Notable for the following components:   Potassium 3.4 (*)    Glucose, Bld 138 (*)    All other components within normal limits  D-DIMER, QUANTITATIVE - Abnormal; Notable for the following components:   D-Dimer, Quant 0.52 (*)    All other components within normal limits  HCG, QUANTITATIVE, PREGNANCY  BRAIN NATRIURETIC PEPTIDE  TROPONIN I (HIGH SENSITIVITY)    EKG EKG Interpretation  Date/Time:  Saturday July 24 2021 23:03:50 EST Ventricular Rate:  101 PR Interval:  145 QRS Duration: 89 QT Interval:  341 QTC Calculation: 442 R Axis:   75 Text Interpretation: Sinus tachycardia Baseline wander in inferior leads makes interpretation difficult, however no clear ST depressions or elevations Confirmed by Ernie AvenaLawsing, Danel Requena ([id number]) on 07/25/2021 12:48:49 AM  Radiology DG Chest 2 View  Result Date: 07/24/2021 CLINICAL DATA:  Aspiration pneumonia. EXAM: CHEST - 2 VIEW COMPARISON:  Chest radiograph dated 06/27/2020. FINDINGS: Diffuse interstitial densities with asymmetric involvement of the left lung may represent asymmetric edema, pneumonia, or aspiration. No pleural effusion pneumothorax. The cardiac silhouette is within normal limits. No acute osseous pathology. IMPRESSION: Diffuse interstitial densities may represent asymmetric edema, pneumonia, or aspiration. Electronically Signed   By: Elgie CollardArash  Radparvar M.D.   On: 07/24/2021 23:25   CT Angio Chest PE W and/or Wo Contrast  Result Date: 07/25/2021 CLINICAL DATA:  Concern for pulmonary embolism. EXAM: CT ANGIOGRAPHY CHEST WITH CONTRAST TECHNIQUE: Multidetector CT imaging of the chest was performed using the standard protocol during bolus administration of intravenous contrast. Multiplanar CT image reconstructions and MIPs were obtained to evaluate the vascular anatomy. CONTRAST:  80mL OMNIPAQUE IOHEXOL 350 MG/ML SOLN COMPARISON:  Chest CT dated  06/27/2020 and radiograph dated 07/24/2021. FINDINGS: Evaluation of this exam is limited due to respiratory motion artifact. Cardiovascular: No cardiomegaly or pericardial effusion. The thoracic aorta is unremarkable. The origins of the great vessels of the aortic arch appear patent as visualized. Evaluation of the pulmonary arteries is limited due to respiratory motion artifact and suboptimal opacification and timing of the contrast. No large or central pulmonary artery embolus identified. Mediastinum/Nodes: No hilar or mediastinal adenopathy. The esophagus and thyroid gland are grossly unremarkable. No mediastinal fluid collection. Lungs/Pleura: Diffuse ground-glass pulmonary opacities, left greater right, may represent edema, pneumonia, or ARDS. Aspiration is not excluded. No pleural effusion or pneumothorax. The central airways are patent. Upper Abdomen: No acute abnormality. Musculoskeletal: No acute osseous pathology. Review of the MIP images confirms the above findings. IMPRESSION: 1. No CT evidence of central pulmonary artery embolus. 2. Diffuse ground-glass pulmonary  opacities, left greater right, may represent edema, pneumonia, or ARDS. Aspiration is not excluded. Electronically Signed   By: Elgie Collard M.D.   On: 07/25/2021 00:29    Procedures Procedures    Medications Ordered in ED Medications  iohexol (OMNIPAQUE) 350 MG/ML injection 100 mL (80 mLs Intravenous Contrast Given 07/25/21 0013)  amoxicillin-clavulanate (AUGMENTIN) 875-125 MG per tablet 1 tablet (1 tablet Oral Given 07/25/21 0044)    ED Course/ Medical Decision Making/ A&P                           Medical Decision Making   31 year old female presenting to the emergency department with a chief complaint of productive cough, hemoptysis, shortness of breath, chest discomfort after an aspiration event yesterday during an endoscopic procedure.  The patient underwent EGD and was evaluated at the emergency department [facility] and treated for presumed flash pulmonary edema with oxygen and Lasix.  She thinks she aspirated.  She was discharged and throughout the day today has had progressive worsening shortness of breath, productive cough with mild pink-tinged sputum, chest pressure and tightness that radiates to her back.  She denies any fever or chills.  She checked her oxygen saturations at home and noted that her saturations were dropping with ambulation.  On arrival, the patient was afebrile, mildly tachycardic P104, not tachypneic, saturating 93-day 100% on room air.  Hemodynamically stable.  Sinus tachycardia to normal sinus rhythm noted on cardiac telemetry.  Patient appears to have suffered an aspiration event with multifocal infiltrates noted on chest x-ray imaging yesterday immediately postprocedure.  She was treated with IV Lasix and oxygen postprocedure as it was thought she had developed flash pulmonary edema.  Patient denies any fever or chills but endorses worsening productive cough, pleuritic chest discomfort and chest pressure and tightness.  She has mild hemoptysis with pink/bloody tinged sputum.  Suspect likely aspiration pneumonitis status post endoscopy yesterday versus developing aspiration pneumonia.  Will obtain further laboratory testing to evaluate cardiac function.  Initial EKG was performed which revealed sinus tachycardia, heart rate 101, no abnormal intervals, wandering baseline in the inferior leads with no clear ST segment elevations or depressions.  Chest x-ray was reviewed by myself and radiology and reveals multifocal opacities concerning for aspiration pneumonia versus pulmonary edema.  Laboratory work-up was initiated and was significant for beta hCG normal, initial troponin 3, BNP normal, D-dimer slightly elevated to 0.52, BMP with mild hypokalemia to 3.4, mild hyperglycemia to 138 otherwise unremarkable, CBC with a mild leukocytosis to 11, mild anemia to 11.7.   Given the  elevated D-dimer, I discussed with the patient the risks and benefit of further CT imaging to rule out PE.  The patient does have slight hemoptysis, was mildly tachycardic and has an elevated D-dimer, however, her D-dimer could be elevated in the setting of an inflammatory response to an aspiration pneumonitis and developing aspiration pneumonia.  After discussion of the risks and benefits, the patient did elect to proceed with CTA imaging of the chest which was performed and reviewed by myself and radiology and reveals no evidence of pulmonary embolism.  There were multifocal opacities consistent with likely aspiration.  Concern for ARDS at this time.  Low concern for pulmonary edema at this time as the patient is not significantly hypertensive and had a recent witnessed aspiration event.  Concern for aspiration pneumonitis in the setting of recent aspiration with potential developing aspiration pneumonia given the patient's worsening  productive cough and leukocytosis.  I discussed the overall treatment course for the patient and her overall clinical picture.  She is saturating well on room air and has had no episodes of hypoxia in the emergency department.  I believe that she is stable for trial of outpatient management.  Will prescribe Augmentin given duration of symptoms approaching 48 hours.  Advise close follow-up with her PCP to ensure improvement and resolution.  Return precautions provided.    Final Clinical Impression(s) / ED Diagnoses Final diagnoses:  Aspiration pneumonitis (HCC)    Rx / DC Orders ED Discharge Orders          Ordered    amoxicillin-clavulanate (AUGMENTIN) 875-125 MG tablet  Every 12 hours        07/25/21 0040              Ernie AvenaLawsing, Deajah Erkkila, MD 07/25/21 [phone]

## 2021-07-24 NOTE — ED Notes (Signed)
ED Provider at bedside. 

## 2021-07-25 ENCOUNTER — Emergency Department (HOSPITAL_BASED_OUTPATIENT_CLINIC_OR_DEPARTMENT_OTHER): Payer: 59

## 2021-07-25 LAB — HCG, QUANTITATIVE, PREGNANCY: hCG, Beta Chain, Quant, S: <1 m[IU]/mL (ref <5)

## 2021-07-25 MED ORDER — AMOXICILLIN-POT CLAVULANATE 875-125 MG PO TABS
1.0000 | ORAL_TABLET | Freq: Once | ORAL | Status: AC
  Administered 2021-07-25: 1 via ORAL
  Filled 2021-07-25: qty 1

## 2021-07-25 MED ORDER — AMOXICILLIN-POT CLAVULANATE 875-125 MG PO TABS: 1.0000 | ORAL_TABLET | Freq: Two times a day (BID) | ORAL | 0 refills | Status: AC

## 2021-07-25 MED ORDER — IOHEXOL 350 MG/ML SOLN
100.0000 mL | Freq: Once | INTRAVENOUS | Status: AC | PRN
  Administered 2021-07-25: 80 mL via INTRAVENOUS

## 2021-07-25 NOTE — ED Notes (Signed)
Patient transported to CT 

## 2021-07-25 NOTE — ED Notes (Signed)
DC instructions reviewed with pt and family at bedside. Pt tolerating po intake without n/v. All questions/concerns addressed at time of d/c. Pt ambulatory to ED exit.

## 2021-07-25 NOTE — Discharge Instructions (Addendum)
You were evaluated in the Emergency Department and after careful evaluation, we did not find any emergent condition requiring admission or further testing in the hospital.  Your exam/testing today was overall reassuring.  Your imaging is concerning for aspiration pneumonitis and developing aspiration pneumonia after an aspiration event during your EGD yesterday.  We will treat with a course of oral antibiotics and have you follow-up outpatient with your PCP to ensure resolution.  Return the emergency department for worsening dyspnea, difficulty with O2 saturations at home.  Your oxygen saturations here in the emergency department were normal.  The antibiotic you will be taking is called Augmentin and can cause nausea and diarrhea as it disrupts your healthy gut flora.  Recommend taking with a full stomach and also consider taking a probiotic while on the medication.  Please return to the Emergency Department if you experience any worsening of your condition.  Thank you for allowing [place] to be a part of your care.

## 2021-07-26 ENCOUNTER — Telehealth: Payer: Self-pay | Admitting: General Practice

## 2021-07-26 NOTE — Telephone Encounter (Signed)
Transition Care Management Follow-up Telephone Call Date of discharge and from where: 07/25/21  from [facility] How have you been since you were released from the hospital? Patient stated she has a different PCP. Any questions or concerns? No

## 2022-02-23 ENCOUNTER — Encounter (INDEPENDENT_AMBULATORY_CARE_PROVIDER_SITE_OTHER): Payer: Self-pay

## 2022-06-02 NOTE — Progress Notes (Deleted)
   Acute Office Visit  Subjective:     Patient ID: Veronica Gay, female    DOB: 19-Aug-1990, 31 y.o.   MRN: [id number]  No chief complaint on file.   HPI Patient is in today for R breast lump.   ROS      Objective:    There were no vitals taken for this visit. {Vitals History (Optional):23777}  Physical Exam  No results found for any visits on 06/03/22.      Assessment & Plan:   Problem List Items Addressed This Visit   None   No orders of the defined types were placed in this encounter.   No follow-ups on file.  Charlton Amor, DO

## 2022-06-03 ENCOUNTER — Ambulatory Visit: Payer: 59 | Admitting: Family Medicine

## 2022-10-21 IMAGING — CT CT ANGIO CHEST
2 of 8 series · 18 of 36 positions shown · IV contrast (Omnipaque)
Comparison: Chest CT dated 06/27/2020 and radiograph dated
07/24/2021.

CLINICAL DATA: Concern for pulmonary embolism.

EXAM:
CT ANGIOGRAPHY CHEST WITH CONTRAST
TECHNIQUE: Multidetector CT imaging of the chest was performed using the
standard protocol during bolus administration of intravenous
contrast. Multiplanar CT image reconstructions and MIPs were
obtained to evaluate the vascular anatomy.
CONTRAST:  80mL OMNIPAQUE IOHEXOL 350 MG/ML SOLN

[Series 6: pe coronal mpr · coronal · 0.59mm/px · 1 of 173 slices shown]
[im 87/173  mediastinal]
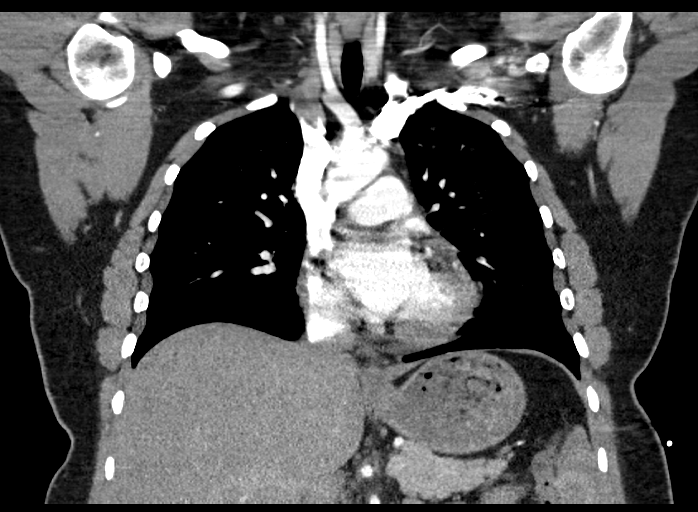

[Series 10: pe thins · axial · 0.85mm/px · z∈[-265,-9]mm · 17 of 288 slices shown]
[im 16/288  lung]
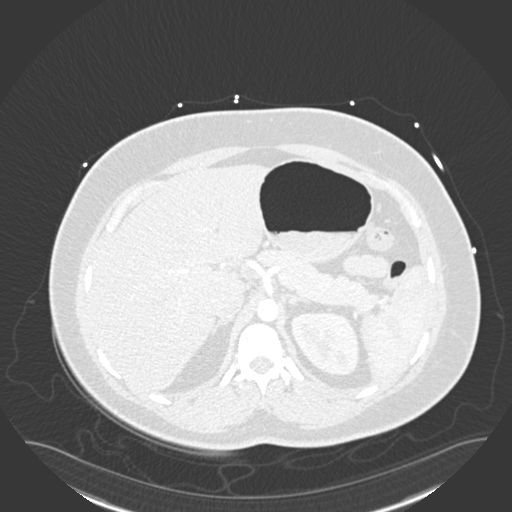
[im 31/288  mediastinal]
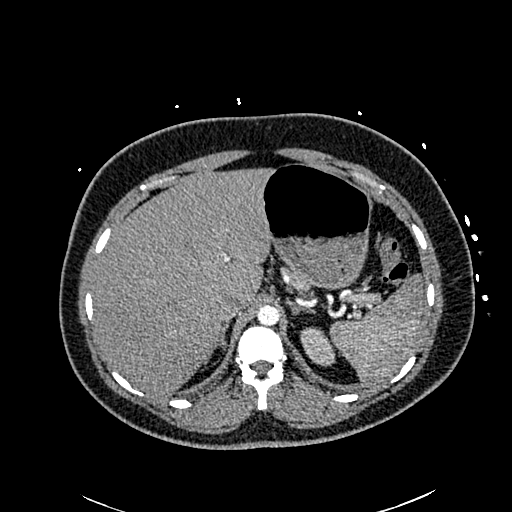
[im 46/288  lung]
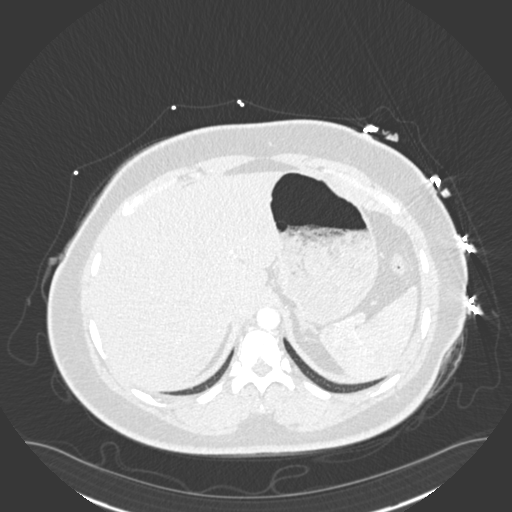
[im 61/288  mediastinal]
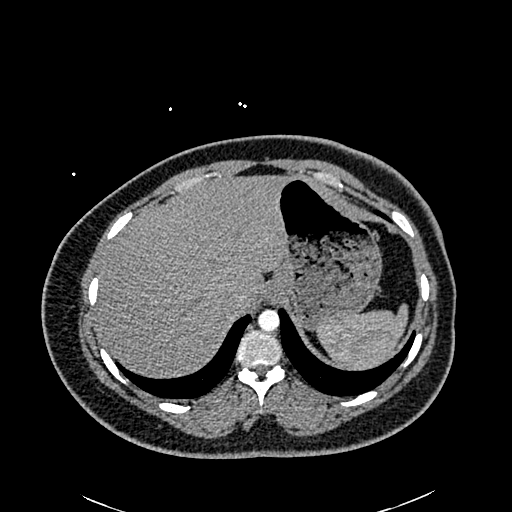
[im 76/288  lung]
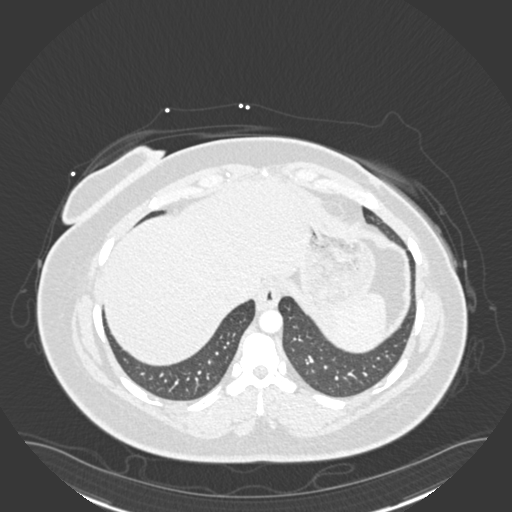
[im 91/288  mediastinal]
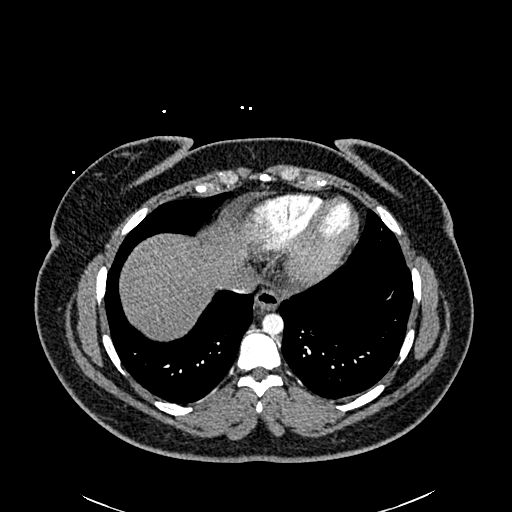
[im 106/288  lung]
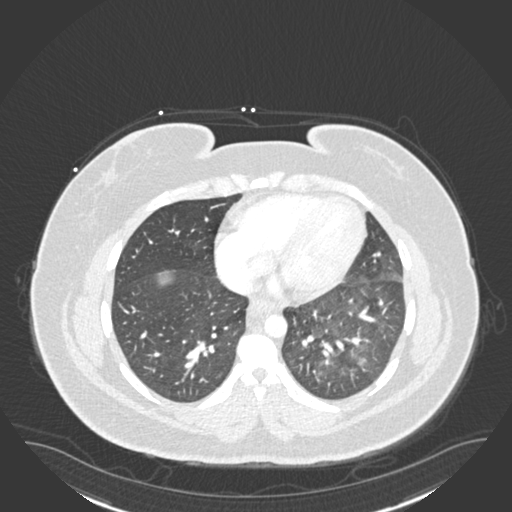
[im 121/288  mediastinal]
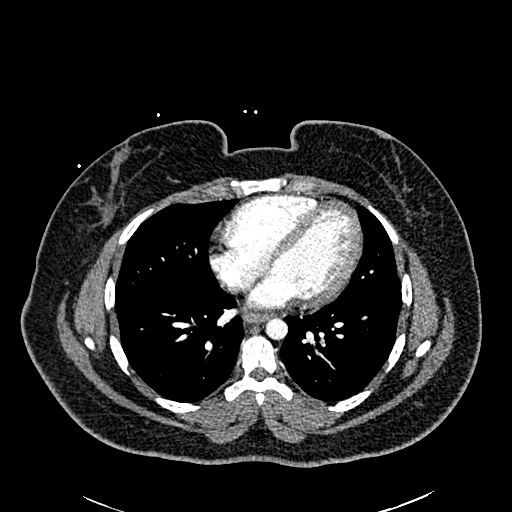
[im 152/288  lung]
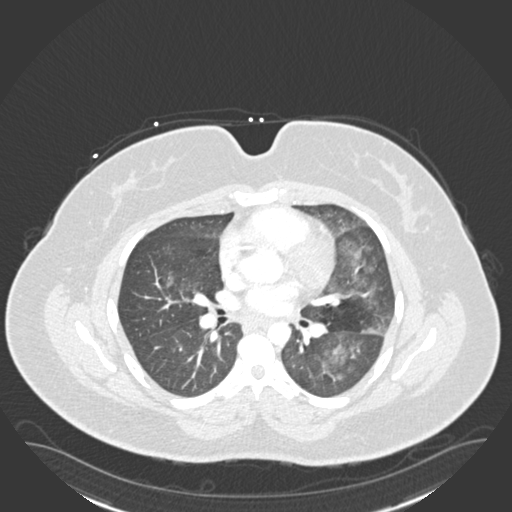
[im 167/288  mediastinal]
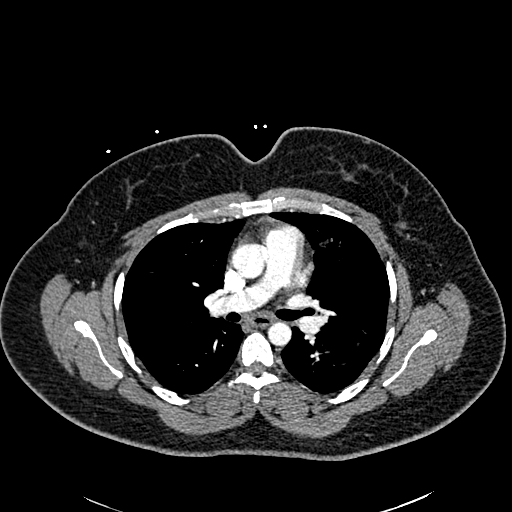
[im 182/288  lung]
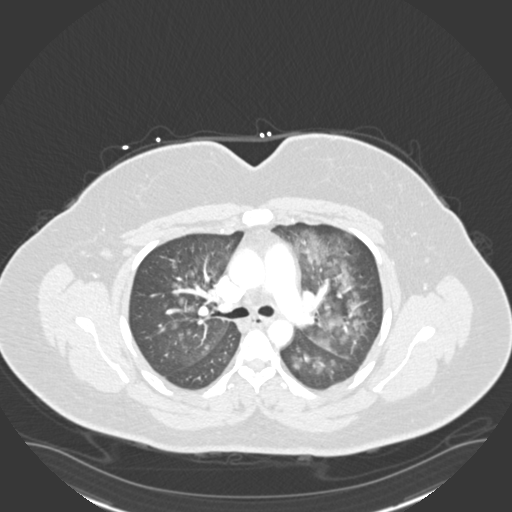
[im 197/288  mediastinal]
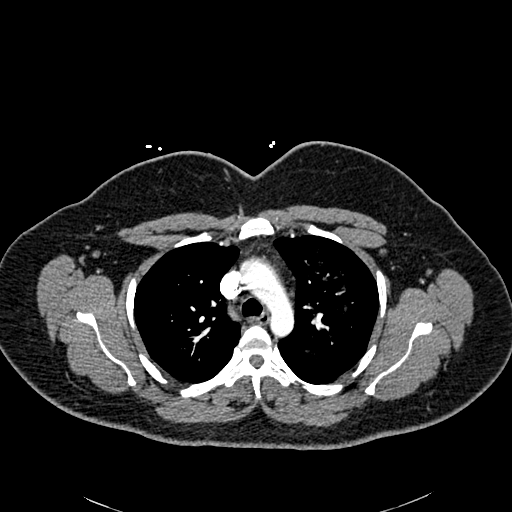
[im 212/288  lung]
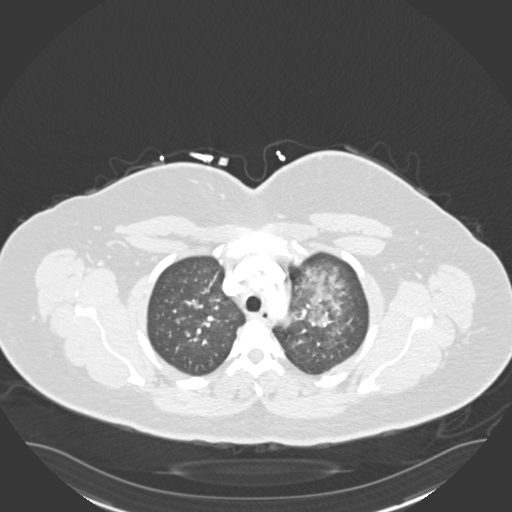
[im 227/288  mediastinal]
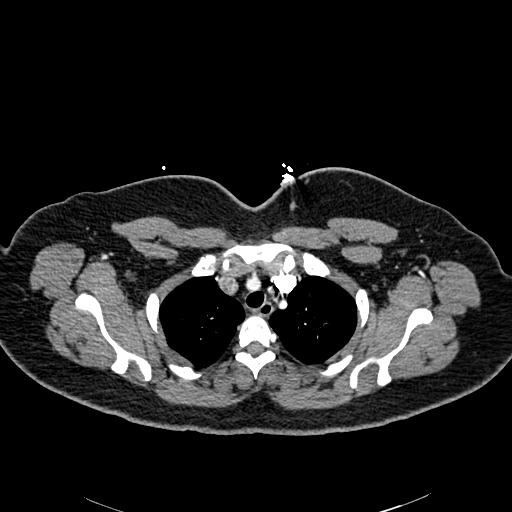
[im 242/288  lung]
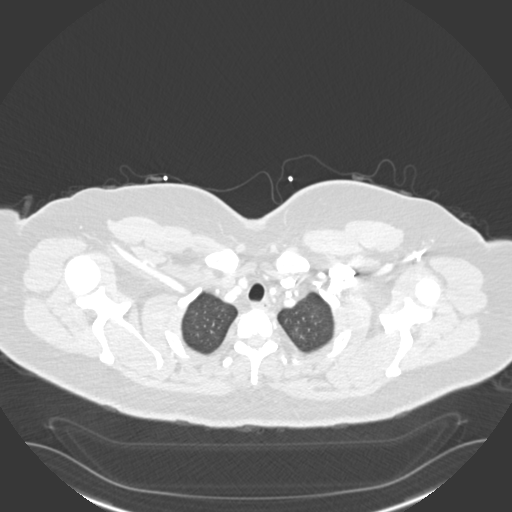
[im 257/288  mediastinal]
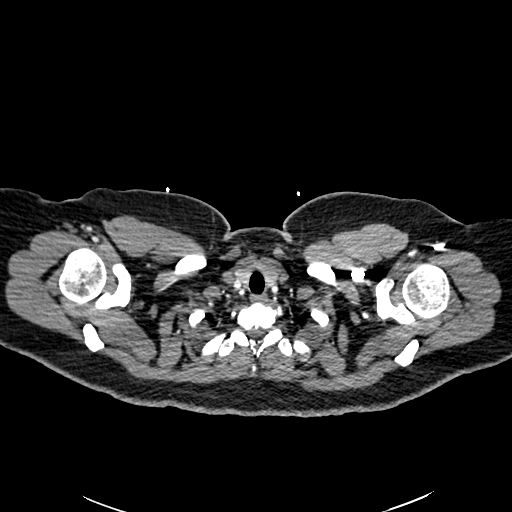
[im 272/288  lung]
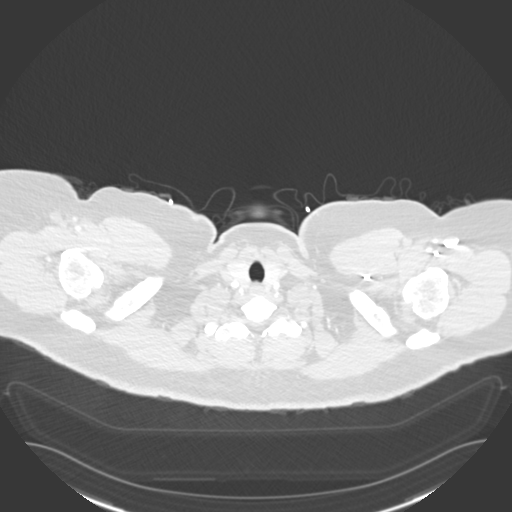

[18 of 36 positions shown; findings below may reference images not displayed]

FINDINGS: Evaluation of this exam is limited due to respiratory motion
artifact.

Cardiovascular: No cardiomegaly or pericardial effusion. The
thoracic aorta is unremarkable. The origins of the great vessels of
the aortic arch appear patent as visualized. Evaluation of the
pulmonary arteries is limited due to respiratory motion artifact and
suboptimal opacification and timing of the contrast. No large or
central pulmonary artery embolus identified.

Mediastinum/Nodes: No hilar or mediastinal adenopathy. The esophagus
and thyroid gland are grossly unremarkable. No mediastinal fluid
collection.

Lungs/Pleura: Diffuse ground-glass pulmonary opacities, left greater
right, may represent edema, pneumonia, or ARDS. Aspiration is not
excluded. No pleural effusion or pneumothorax. The central airways
are patent.

Upper Abdomen: No acute abnormality.

Musculoskeletal: No acute osseous pathology.

Review of the MIP images confirms the above findings.
IMPRESSION: 1. No CT evidence of central pulmonary artery embolus.
2. Diffuse ground-glass pulmonary opacities, left greater right, may
represent edema, pneumonia, or ARDS. Aspiration is not excluded.

## 2023-09-18 ENCOUNTER — Other Ambulatory Visit: Payer: Self-pay | Admitting: Family Medicine

## 2023-09-18 DIAGNOSIS — R590 Localized enlarged lymph nodes: Secondary | ICD-10-CM

## 2023-09-21 ENCOUNTER — Ambulatory Visit
Admission: RE | Admit: 2023-09-21 | Discharge: 2023-09-21 | Disposition: A | Discharge status: Home / Self Care | Source: Ambulatory Visit | Attending: Family Medicine | Admitting: Family Medicine

## 2023-09-21 DIAGNOSIS — R590 Localized enlarged lymph nodes: Secondary | ICD-10-CM

## 2023-09-25 ENCOUNTER — Other Ambulatory Visit: Payer: Self-pay | Admitting: Family Medicine

## 2023-09-25 ENCOUNTER — Encounter: Payer: Self-pay | Admitting: Family Medicine

## 2023-09-25 DIAGNOSIS — R591 Generalized enlarged lymph nodes: Secondary | ICD-10-CM

## 2023-09-28 ENCOUNTER — Ambulatory Visit
Admission: RE | Admit: 2023-09-28 | Discharge: 2023-09-28 | Discharge status: Home / Self Care | Source: Ambulatory Visit | Attending: Family Medicine

## 2023-09-28 DIAGNOSIS — R591 Generalized enlarged lymph nodes: Secondary | ICD-10-CM

## 2023-09-28 MED ORDER — IOPAMIDOL (ISOVUE-300) INJECTION 61%
75.0000 mL | Freq: Once | INTRAVENOUS | Status: AC | PRN
  Administered 2023-09-28: 75 mL via INTRAVENOUS

## 2023-11-24 ENCOUNTER — Telehealth: Payer: Self-pay

## 2023-11-24 NOTE — Telephone Encounter (Signed)
 LVM to move patient to 5/20 due to Dr. Larkin Plumb in surgery on 5/19. Patient to keep the same time.

## 2023-12-04 ENCOUNTER — Institutional Professional Consult (permissible substitution) (INDEPENDENT_AMBULATORY_CARE_PROVIDER_SITE_OTHER): Admitting: Otolaryngology
